# Patient Record
Sex: Female | Born: 1945 | Race: White | Hispanic: Yes | Marital: Married | Smoking: Never smoker
Health system: Southern US, Community
[De-identification: ages and names within clinical notes are randomized; demographics above are authoritative.]

## PROBLEM LIST (undated history)

## (undated) DIAGNOSIS — K219 Gastro-esophageal reflux disease without esophagitis: Secondary | ICD-10-CM

## (undated) DIAGNOSIS — I10 Essential (primary) hypertension: Secondary | ICD-10-CM

## (undated) DIAGNOSIS — F419 Anxiety disorder, unspecified: Secondary | ICD-10-CM

## (undated) DIAGNOSIS — R000 Tachycardia, unspecified: Secondary | ICD-10-CM

## (undated) DIAGNOSIS — F32A Depression, unspecified: Secondary | ICD-10-CM

## (undated) DIAGNOSIS — E119 Type 2 diabetes mellitus without complications: Secondary | ICD-10-CM

## (undated) DIAGNOSIS — R112 Nausea with vomiting, unspecified: Secondary | ICD-10-CM

## (undated) DIAGNOSIS — M1990 Unspecified osteoarthritis, unspecified site: Secondary | ICD-10-CM

## (undated) DIAGNOSIS — E7800 Pure hypercholesterolemia, unspecified: Secondary | ICD-10-CM

## (undated) DIAGNOSIS — F329 Major depressive disorder, single episode, unspecified: Secondary | ICD-10-CM

## (undated) DIAGNOSIS — K759 Inflammatory liver disease, unspecified: Secondary | ICD-10-CM

## (undated) DIAGNOSIS — E669 Obesity, unspecified: Secondary | ICD-10-CM

## (undated) DIAGNOSIS — Z9889 Other specified postprocedural states: Secondary | ICD-10-CM

## (undated) DIAGNOSIS — K259 Gastric ulcer, unspecified as acute or chronic, without hemorrhage or perforation: Secondary | ICD-10-CM

## (undated) DIAGNOSIS — F410 Panic disorder [episodic paroxysmal anxiety] without agoraphobia: Secondary | ICD-10-CM

## (undated) HISTORY — DX: Panic disorder (episodic paroxysmal anxiety): F41.0

## (undated) HISTORY — DX: Pure hypercholesterolemia, unspecified: E78.00

## (undated) HISTORY — DX: Tachycardia, unspecified: R00.0

## (undated) HISTORY — DX: Inflammatory liver disease, unspecified: K75.9

## (undated) HISTORY — DX: Anxiety disorder, unspecified: F41.9

## (undated) HISTORY — PX: BREAST BIOPSY: SHX20

## (undated) HISTORY — DX: Obesity, unspecified: E66.9

## (undated) HISTORY — PX: ABDOMINAL HYSTERECTOMY: SHX81

## (undated) HISTORY — DX: Major depressive disorder, single episode, unspecified: F32.9

## (undated) HISTORY — DX: Essential (primary) hypertension: I10

## (undated) HISTORY — PX: OTHER SURGICAL HISTORY: SHX169

## (undated) HISTORY — DX: Depression, unspecified: F32.A

## (undated) HISTORY — PX: CHOLECYSTECTOMY: SHX55

## (undated) HISTORY — PX: WRIST SURGERY: SHX841

## (undated) HISTORY — DX: Unspecified osteoarthritis, unspecified site: M19.90

## (undated) HISTORY — DX: Type 2 diabetes mellitus without complications: E11.9

---

## 1978-12-13 DIAGNOSIS — K759 Inflammatory liver disease, unspecified: Secondary | ICD-10-CM

## 1978-12-13 HISTORY — DX: Inflammatory liver disease, unspecified: K75.9

## 1995-08-17 ENCOUNTER — Encounter: Payer: Self-pay | Admitting: Gynecology

## 1996-01-10 ENCOUNTER — Encounter: Payer: Self-pay | Admitting: Gynecology

## 1996-02-01 ENCOUNTER — Encounter: Payer: Self-pay | Admitting: Gynecology

## 1996-02-23 ENCOUNTER — Encounter: Payer: Self-pay | Admitting: Gynecology

## 1997-06-29 ENCOUNTER — Encounter: Payer: Self-pay | Admitting: Gynecology

## 1998-08-16 ENCOUNTER — Other Ambulatory Visit: Admission: RE | Admit: 1998-08-16 | Discharge: 1998-08-16 | Payer: Self-pay | Admitting: Gynecology

## 1998-08-16 ENCOUNTER — Encounter: Payer: Self-pay | Admitting: Gynecology

## 1999-09-12 ENCOUNTER — Ambulatory Visit (HOSPITAL_COMMUNITY): Admission: RE | Admit: 1999-09-12 | Discharge: 1999-09-12 | Payer: Self-pay | Admitting: *Deleted

## 1999-09-12 ENCOUNTER — Encounter: Admission: RE | Admit: 1999-09-12 | Discharge: 1999-09-12 | Payer: Self-pay | Admitting: *Deleted

## 1999-09-12 ENCOUNTER — Encounter: Payer: Self-pay | Admitting: *Deleted

## 1999-12-04 ENCOUNTER — Encounter: Payer: Self-pay | Admitting: Gynecology

## 1999-12-04 ENCOUNTER — Other Ambulatory Visit: Admission: RE | Admit: 1999-12-04 | Discharge: 1999-12-04 | Payer: Self-pay | Admitting: Gynecology

## 2000-10-08 ENCOUNTER — Encounter: Admission: RE | Admit: 2000-10-08 | Discharge: 2000-10-08 | Payer: Self-pay | Admitting: *Deleted

## 2000-10-08 ENCOUNTER — Encounter: Payer: Self-pay | Admitting: *Deleted

## 2000-12-09 ENCOUNTER — Encounter: Admission: RE | Admit: 2000-12-09 | Discharge: 2001-01-05 | Payer: Self-pay | Admitting: Neurosurgery

## 2001-03-30 ENCOUNTER — Other Ambulatory Visit: Admission: RE | Admit: 2001-03-30 | Discharge: 2001-03-30 | Payer: Self-pay | Admitting: Gynecology

## 2001-03-30 ENCOUNTER — Encounter: Payer: Self-pay | Admitting: Gynecology

## 2001-05-11 ENCOUNTER — Emergency Department (HOSPITAL_COMMUNITY): Admission: EM | Admit: 2001-05-11 | Discharge: 2001-05-12 | Payer: Self-pay

## 2002-07-18 ENCOUNTER — Encounter: Payer: Self-pay | Admitting: Gynecology

## 2002-07-18 ENCOUNTER — Other Ambulatory Visit: Admission: RE | Admit: 2002-07-18 | Discharge: 2002-07-18 | Payer: Self-pay | Admitting: Gynecology

## 2002-08-18 ENCOUNTER — Encounter: Payer: Self-pay | Admitting: Gynecology

## 2003-06-25 ENCOUNTER — Encounter (INDEPENDENT_AMBULATORY_CARE_PROVIDER_SITE_OTHER): Payer: Self-pay | Admitting: Specialist

## 2003-06-25 ENCOUNTER — Ambulatory Visit (HOSPITAL_COMMUNITY): Admission: RE | Admit: 2003-06-25 | Discharge: 2003-06-25 | Payer: Self-pay | Admitting: Gastroenterology

## 2003-09-19 ENCOUNTER — Encounter: Payer: Self-pay | Admitting: Gynecology

## 2003-10-12 ENCOUNTER — Encounter: Payer: Self-pay | Admitting: Gynecology

## 2003-10-12 ENCOUNTER — Other Ambulatory Visit: Admission: RE | Admit: 2003-10-12 | Discharge: 2003-10-12 | Payer: Self-pay | Admitting: Gynecology

## 2004-07-07 ENCOUNTER — Encounter: Admission: RE | Admit: 2004-07-07 | Discharge: 2004-07-17 | Payer: Self-pay | Admitting: Neurosurgery

## 2005-01-20 ENCOUNTER — Encounter: Payer: Self-pay | Admitting: Gynecology

## 2005-01-20 ENCOUNTER — Other Ambulatory Visit: Admission: RE | Admit: 2005-01-20 | Discharge: 2005-01-20 | Payer: Self-pay | Admitting: Gynecology

## 2005-02-04 ENCOUNTER — Encounter: Payer: Self-pay | Admitting: Gynecology

## 2006-03-05 ENCOUNTER — Encounter: Payer: Self-pay | Admitting: Gynecology

## 2006-03-30 ENCOUNTER — Other Ambulatory Visit: Admission: RE | Admit: 2006-03-30 | Discharge: 2006-03-30 | Payer: Self-pay | Admitting: Gynecology

## 2006-03-30 ENCOUNTER — Encounter: Payer: Self-pay | Admitting: Gynecology

## 2007-01-21 ENCOUNTER — Ambulatory Visit: Payer: Self-pay | Admitting: Internal Medicine

## 2007-01-21 ENCOUNTER — Encounter: Payer: Self-pay | Admitting: Internal Medicine

## 2007-01-21 DIAGNOSIS — I10 Essential (primary) hypertension: Secondary | ICD-10-CM | POA: Insufficient documentation

## 2007-01-21 DIAGNOSIS — F3289 Other specified depressive episodes: Secondary | ICD-10-CM | POA: Insufficient documentation

## 2007-01-21 DIAGNOSIS — M810 Age-related osteoporosis without current pathological fracture: Secondary | ICD-10-CM | POA: Insufficient documentation

## 2007-01-21 DIAGNOSIS — R0609 Other forms of dyspnea: Secondary | ICD-10-CM

## 2007-01-21 DIAGNOSIS — K219 Gastro-esophageal reflux disease without esophagitis: Secondary | ICD-10-CM

## 2007-01-21 DIAGNOSIS — R42 Dizziness and giddiness: Secondary | ICD-10-CM

## 2007-01-21 DIAGNOSIS — E785 Hyperlipidemia, unspecified: Secondary | ICD-10-CM | POA: Insufficient documentation

## 2007-01-21 DIAGNOSIS — F329 Major depressive disorder, single episode, unspecified: Secondary | ICD-10-CM

## 2007-01-21 DIAGNOSIS — R0989 Other specified symptoms and signs involving the circulatory and respiratory systems: Secondary | ICD-10-CM | POA: Insufficient documentation

## 2007-01-24 ENCOUNTER — Encounter: Payer: Self-pay | Admitting: Internal Medicine

## 2007-01-25 DIAGNOSIS — R945 Abnormal results of liver function studies: Secondary | ICD-10-CM

## 2007-01-25 LAB — CONVERTED CEMR LAB
ALT: 48 units/L — ABNORMAL HIGH (ref 0–35)
AST: 45 units/L — ABNORMAL HIGH (ref 0–37)
Basophils Absolute: 0 10*3/uL (ref 0.0–0.1)
Calcium: 9.4 mg/dL (ref 8.4–10.5)
Eosinophils Absolute: 0.2 10*3/uL (ref 0.0–0.7)
Eosinophils Relative: 3 % (ref 0–5)
HCT: 44.4 % (ref 36.0–46.0)
Lymphs Abs: 2.6 10*3/uL (ref 0.7–3.3)
MCV: 84.1 fL (ref 78.0–100.0)
Neutrophils Relative %: 51 % (ref 43–77)
Platelets: 225 10*3/uL (ref 150–400)
Potassium: 4.8 meq/L (ref 3.5–5.3)
RDW: 13.2 % (ref 11.5–14.0)
Sodium: 140 meq/L (ref 135–145)
TSH: 1.798 microintl units/mL (ref 0.350–5.50)
WBC: 6.5 10*3/uL (ref 4.0–10.5)

## 2007-02-11 ENCOUNTER — Ambulatory Visit: Payer: Self-pay | Admitting: Internal Medicine

## 2007-02-11 DIAGNOSIS — R7309 Other abnormal glucose: Secondary | ICD-10-CM

## 2007-02-25 ENCOUNTER — Ambulatory Visit: Payer: Self-pay | Admitting: Internal Medicine

## 2007-02-28 DIAGNOSIS — E119 Type 2 diabetes mellitus without complications: Secondary | ICD-10-CM | POA: Insufficient documentation

## 2007-02-28 LAB — CONVERTED CEMR LAB
AST: 38 units/L — ABNORMAL HIGH (ref 0–37)
Albumin: 4.3 g/dL (ref 3.5–5.2)
Bilirubin, Direct: 0.1 mg/dL (ref 0.0–0.3)
CO2: 29 meq/L (ref 19–32)
Chloride: 99 meq/L (ref 96–112)
Cholesterol: 158 mg/dL (ref 0–200)
Creatinine, Ser: 0.6 mg/dL (ref 0.4–1.2)
Glucose, Bld: 189 mg/dL — ABNORMAL HIGH (ref 70–99)
HDL: 46 mg/dL (ref >39.0)
Hgb A1c MFr Bld: 8.5 % — ABNORMAL HIGH (ref 4.6–6.0)
Potassium: 3.8 meq/L (ref 3.5–5.1)
Sodium: 137 meq/L (ref 135–145)
Total Bilirubin: 0.6 mg/dL (ref 0.3–1.2)
Total Protein: 8.1 g/dL (ref 6.0–8.3)

## 2007-03-03 LAB — CONVERTED CEMR LAB: Hep A IgM: NEGATIVE

## 2007-03-04 ENCOUNTER — Encounter: Admission: RE | Admit: 2007-03-04 | Discharge: 2007-03-04 | Payer: Self-pay | Admitting: Internal Medicine

## 2007-03-14 ENCOUNTER — Encounter: Payer: Self-pay | Admitting: Internal Medicine

## 2007-03-14 ENCOUNTER — Encounter: Admission: RE | Admit: 2007-03-14 | Discharge: 2007-03-14 | Payer: Self-pay | Admitting: Internal Medicine

## 2007-04-15 ENCOUNTER — Encounter: Payer: Self-pay | Admitting: Internal Medicine

## 2007-04-15 ENCOUNTER — Encounter: Payer: Self-pay | Admitting: Gynecology

## 2007-04-15 ENCOUNTER — Other Ambulatory Visit: Admission: RE | Admit: 2007-04-15 | Discharge: 2007-04-15 | Payer: Self-pay | Admitting: Gynecology

## 2007-05-30 ENCOUNTER — Emergency Department: Payer: Self-pay | Admitting: Emergency Medicine

## 2007-06-06 ENCOUNTER — Encounter (INDEPENDENT_AMBULATORY_CARE_PROVIDER_SITE_OTHER): Payer: Self-pay | Admitting: *Deleted

## 2008-02-09 ENCOUNTER — Encounter: Payer: Self-pay | Admitting: Gynecology

## 2008-04-30 ENCOUNTER — Other Ambulatory Visit: Admission: RE | Admit: 2008-04-30 | Discharge: 2008-04-30 | Payer: Self-pay | Admitting: Gynecology

## 2008-04-30 ENCOUNTER — Encounter: Payer: Self-pay | Admitting: Gynecology

## 2008-04-30 ENCOUNTER — Ambulatory Visit: Payer: Self-pay | Admitting: Gynecology

## 2008-10-13 ENCOUNTER — Emergency Department: Payer: Self-pay | Admitting: Emergency Medicine

## 2009-06-05 ENCOUNTER — Other Ambulatory Visit: Admission: RE | Admit: 2009-06-05 | Discharge: 2009-06-05 | Payer: Self-pay | Admitting: Gynecology

## 2009-06-05 ENCOUNTER — Ambulatory Visit: Payer: Self-pay | Admitting: Gynecology

## 2009-06-11 ENCOUNTER — Encounter: Payer: Self-pay | Admitting: Gynecology

## 2009-07-31 ENCOUNTER — Ambulatory Visit: Payer: Self-pay | Admitting: Gynecology

## 2010-05-11 LAB — CONVERTED CEMR LAB: Pap Smear: NORMAL

## 2010-08-29 NOTE — Op Note (Signed)
Amber Patton, Amber Patton                          ACCOUNT NO.:  [id number]   MEDICAL RECORD NO.:  [id number]                   PATIENT TYPE:  AMB   LOCATION:  ENDO                                 FACILITY:  [facility]   PHYSICIAN:  Anselmo Rod, M.D.               DATE OF BIRTH:  11/30/1945   DATE OF PROCEDURE:  06/25/2003  DATE OF DISCHARGE:                                 OPERATIVE REPORT   PROCEDURE PERFORMED:  Colonoscopy with cold biopsies times two.   ENDOSCOPIST:  Charna Elizabeth, M.D.   INSTRUMENT USED:  Olympus video colonoscope.   INDICATIONS FOR PROCEDURE:  The patient is a 65 year old [place] undergoing  screening colonoscopy to rule out colonic polyps, masses, etc.   PREPROCEDURE PREPARATION:  Informed consent was procured from the patient.  The patient was fasted for eight hours prior to the procedure and prepped  with a bottle of magnesium citrate and a gallon of GoLYTELY the night prior  to the procedure.   PREPROCEDURE PHYSICAL:  The patient had stable vital signs.  Neck supple.  Chest clear to auscultation.  S1 and S2 regular.  Abdomen soft with normal  bowel sounds.   DESCRIPTION OF PROCEDURE:  The patient was placed in left lateral decubitus  position and sedated with 90 mg of Demerol and 9 mg of Versed intravenously  in slow incremental doses.  Once the patient was adequately sedated and  maintained on low flow oxygen and continuous cardiac monitoring, the Olympus  video colonoscope was advanced from the rectum to the cecum.  A small  sessile polyp was biopsied at 15 cm.  Small internal hemorrhoids were noted  on retroflexion.  There was no evidence of diverticulosis.  The procedure  was completed up to the cecum.  The appendicular orifice and ileocecal valve  were clearly visualized and photographed.   IMPRESSION:  1. Small nonbleeding internal hemorrhoids.  2. Small polyp biopsied at 15 cm.  3. Normal-appearing proximal left colon, transverse colon, right colon  and     cecum.   RECOMMENDATIONS:  1. Await pathology results.  2. Repeat colorectal cancer screening depending on pathology results.  3. Outpatient followup as need arises in the future.                                               Anselmo Rod, M.D.    JNM/MEDQ  D:  06/25/2003  T:  06/26/2003  Job:  [id number]   cc:   Gaetano Hawthorne. Lily Peer, M.D.  [address], [address]  [place]  [place] [address]  Fax: [phone]

## 2010-10-23 ENCOUNTER — Encounter: Payer: Self-pay | Admitting: Gynecology

## 2010-12-12 ENCOUNTER — Emergency Department (HOSPITAL_COMMUNITY)
Admission: EM | Admit: 2010-12-12 | Discharge: 2010-12-12 | Disposition: A | Discharge status: Home / Self Care | Payer: Medicare Other | Attending: Emergency Medicine | Admitting: Emergency Medicine

## 2010-12-12 ENCOUNTER — Emergency Department (HOSPITAL_COMMUNITY): Payer: Medicare Other

## 2010-12-12 DIAGNOSIS — I10 Essential (primary) hypertension: Secondary | ICD-10-CM | POA: Insufficient documentation

## 2010-12-12 DIAGNOSIS — M719 Bursopathy, unspecified: Secondary | ICD-10-CM | POA: Insufficient documentation

## 2010-12-12 DIAGNOSIS — E119 Type 2 diabetes mellitus without complications: Secondary | ICD-10-CM | POA: Insufficient documentation

## 2010-12-12 DIAGNOSIS — Z79899 Other long term (current) drug therapy: Secondary | ICD-10-CM | POA: Insufficient documentation

## 2010-12-12 DIAGNOSIS — M25519 Pain in unspecified shoulder: Secondary | ICD-10-CM | POA: Insufficient documentation

## 2010-12-12 DIAGNOSIS — M79609 Pain in unspecified limb: Secondary | ICD-10-CM | POA: Insufficient documentation

## 2010-12-12 DIAGNOSIS — E7800 Pure hypercholesterolemia, unspecified: Secondary | ICD-10-CM | POA: Insufficient documentation

## 2010-12-12 DIAGNOSIS — M67919 Unspecified disorder of synovium and tendon, unspecified shoulder: Secondary | ICD-10-CM | POA: Insufficient documentation

## 2010-12-12 DIAGNOSIS — M542 Cervicalgia: Secondary | ICD-10-CM | POA: Insufficient documentation

## 2011-01-16 ENCOUNTER — Encounter: Payer: Self-pay | Admitting: Gynecology

## 2011-08-25 ENCOUNTER — Encounter: Payer: Self-pay | Admitting: *Deleted

## 2011-12-25 ENCOUNTER — Encounter: Payer: Self-pay | Admitting: Cardiology

## 2012-07-31 ENCOUNTER — Encounter (HOSPITAL_COMMUNITY): Payer: Self-pay | Admitting: Emergency Medicine

## 2012-07-31 ENCOUNTER — Emergency Department (HOSPITAL_COMMUNITY)
Admission: EM | Admit: 2012-07-31 | Discharge: 2012-08-01 | Disposition: A | Discharge status: Home / Self Care | Payer: Medicare Other | Attending: Emergency Medicine | Admitting: Emergency Medicine

## 2012-07-31 DIAGNOSIS — R0602 Shortness of breath: Secondary | ICD-10-CM | POA: Insufficient documentation

## 2012-07-31 DIAGNOSIS — Z8719 Personal history of other diseases of the digestive system: Secondary | ICD-10-CM | POA: Insufficient documentation

## 2012-07-31 DIAGNOSIS — Z9089 Acquired absence of other organs: Secondary | ICD-10-CM | POA: Insufficient documentation

## 2012-07-31 DIAGNOSIS — E1169 Type 2 diabetes mellitus with other specified complication: Secondary | ICD-10-CM | POA: Insufficient documentation

## 2012-07-31 DIAGNOSIS — E119 Type 2 diabetes mellitus without complications: Secondary | ICD-10-CM

## 2012-07-31 DIAGNOSIS — R739 Hyperglycemia, unspecified: Secondary | ICD-10-CM

## 2012-07-31 DIAGNOSIS — E7800 Pure hypercholesterolemia, unspecified: Secondary | ICD-10-CM | POA: Insufficient documentation

## 2012-07-31 DIAGNOSIS — K219 Gastro-esophageal reflux disease without esophagitis: Secondary | ICD-10-CM | POA: Insufficient documentation

## 2012-07-31 DIAGNOSIS — Z90710 Acquired absence of both cervix and uterus: Secondary | ICD-10-CM | POA: Insufficient documentation

## 2012-07-31 DIAGNOSIS — Z79899 Other long term (current) drug therapy: Secondary | ICD-10-CM | POA: Insufficient documentation

## 2012-07-31 DIAGNOSIS — R112 Nausea with vomiting, unspecified: Secondary | ICD-10-CM | POA: Insufficient documentation

## 2012-07-31 DIAGNOSIS — R1013 Epigastric pain: Secondary | ICD-10-CM

## 2012-07-31 DIAGNOSIS — I10 Essential (primary) hypertension: Secondary | ICD-10-CM | POA: Insufficient documentation

## 2012-07-31 HISTORY — DX: Gastro-esophageal reflux disease without esophagitis: K21.9

## 2012-07-31 LAB — CBC WITH DIFFERENTIAL/PLATELET
Basophils Absolute: 0 10*3/uL (ref 0.0–0.1)
Basophils Relative: 0 % (ref 0–1)
Eosinophils Absolute: 0.1 10*3/uL (ref 0.0–0.7)
Hemoglobin: 14.4 g/dL (ref 12.0–15.0)
MCH: 27.4 pg (ref 26.0–34.0)
MCHC: 34.4 g/dL (ref 30.0–36.0)
Monocytes Relative: 3 % (ref 3–12)
Neutro Abs: 7.6 10*3/uL (ref 1.7–7.7)
Neutrophils Relative %: 83 % — ABNORMAL HIGH (ref 43–77)
Platelets: 202 10*3/uL (ref 150–400)
RDW: 13.6 % (ref 11.5–15.5)

## 2012-07-31 LAB — URINALYSIS, ROUTINE W REFLEX MICROSCOPIC
Glucose, UA: NEGATIVE mg/dL
Nitrite: NEGATIVE
Protein, ur: NEGATIVE mg/dL
Urobilinogen, UA: 0.2 mg/dL (ref 0.0–1.0)

## 2012-07-31 LAB — COMPREHENSIVE METABOLIC PANEL
AST: 14 U/L (ref 0–37)
Albumin: 4.1 g/dL (ref 3.5–5.2)
Alkaline Phosphatase: 129 U/L — ABNORMAL HIGH (ref 39–117)
Chloride: 94 mEq/L — ABNORMAL LOW (ref 96–112)
Potassium: 3.6 mEq/L (ref 3.5–5.1)
Sodium: 132 mEq/L — ABNORMAL LOW (ref 135–145)
Total Bilirubin: 0.4 mg/dL (ref 0.3–1.2)
Total Protein: 7.6 g/dL (ref 6.0–8.3)

## 2012-07-31 LAB — URINE MICROSCOPIC-ADD ON

## 2012-07-31 NOTE — ED Notes (Signed)
PT. REPORTS PERSISTENT NAUSEA WITH VOMITTING AFTER EATING CHICKEN WINGS LAST NIGHT WITH MID/UPPER ABDOMINAL CRAMPING.

## 2012-08-01 LAB — POCT I-STAT TROPONIN I

## 2012-08-01 MED ORDER — ONDANSETRON HCL 4 MG/2ML IJ SOLN
4.0000 mg | Freq: Once | INTRAMUSCULAR | Status: AC
  Administered 2012-08-01: 4 mg via INTRAVENOUS
  Filled 2012-08-01: qty 2

## 2012-08-01 MED ORDER — FAMOTIDINE IN NACL 20-0.9 MG/50ML-% IV SOLN
20.0000 mg | Freq: Once | INTRAVENOUS | Status: AC
  Administered 2012-08-01: 20 mg via INTRAVENOUS
  Filled 2012-08-01: qty 50

## 2012-08-01 MED ORDER — SODIUM CHLORIDE 0.9 % IV BOLUS (SEPSIS)
1000.0000 mL | Freq: Once | INTRAVENOUS | Status: AC
  Administered 2012-08-01: 1000 mL via INTRAVENOUS

## 2012-08-01 MED ORDER — ONDANSETRON HCL 4 MG PO TABS: 4.0000 mg | ORAL_TABLET | Freq: Four times a day (QID) | ORAL | Status: DC

## 2012-08-01 NOTE — ED Notes (Signed)
New EKG given to Dr Arnoldo Morale. No old EKG.

## 2012-08-01 NOTE — ED Notes (Signed)
Pt alert, NAD, calm, interactive, "feels better", reports mild pain & no nausea, d/c'd from monitor, up to b/r, changing back in to clothes in b/r, husband at [facility], VSS.

## 2012-08-01 NOTE — ED Provider Notes (Signed)
History     CSN: [id number]  Arrival date & time 07/31/12  2219   First MD Initiated Contact with Patient 07/31/12 2359      Chief Complaint  Patient presents with  . Emesis    (Consider location/radiation/quality/duration/timing/severity/associated sxs/prior treatment) HPI Patient is a pleasant middle aged type 2 diabetic with HTN and HL along with a remote h/o cholecystectomy and hysterectomy. She presents with complaints of nausea, vomiting and abdominal  Pain which began this morning. The patient has had 3 episodes of NBNB emesis. No diarrhea. No fever.  Patient is concerned that she may have eated some spoiled chicken. She says her sx began 28m after she finished some chicken that she had brought home from a restaurant a couple of days before.   Pain localizes to the midline epigastric and is nonradiating. It is constant, cramping, moderately severe. No excacerbating or relieving factors. No chest pain. No sob.    Past Medical History  Diagnosis Date  . DM2 (diabetes mellitus, type 2)   . HTN (hypertension)   . Hypercholesteremia   . Hepatitis 1980's  . GERD (gastroesophageal reflux disease)   . High cholesterol     Past Surgical History  Procedure Laterality Date  . Hysterectomy - unknown type    . Breast biopsy      Family History  Problem Relation Age of Onset  . Cancer    . Stroke    . Cirrhosis    . Diabetes    . Hyperlipidemia      History  Substance Use Topics  . Smoking status: Never Smoker   . Smokeless tobacco: Not on file  . Alcohol Use: Yes     Comment: rarely    OB History   Grav Para Term Preterm Abortions TAB SAB Ect Mult Living                  Review of Systems Gen: no weight loss, fevers, chills, night sweats Eyes: no discharge or drainage, no occular pain or visual changes Nose: no epistaxis or rhinorrhea Mouth: no dental pain, no sore throat Neck: no neck pain Lungs: no SOB, cough, wheezing CV: no chest pain, palpitations,  dependent edema or orthopnea Abd: As per history of present illness, otherwise negative GU: no dysuria or gross hematuria MSK: no myalgias or arthralgias Neuro: no headache, no focal neurologic deficits Skin: no rash Psyche: negative.  Allergies  Aspirin and Vicodin  Home Medications   Current Outpatient Rx  Name  Route  Sig  Dispense  Refill  . amLODipine (NORVASC) 10 MG tablet   Oral   Take 10 mg by mouth daily.         Marland Kitchen atorvastatin (LIPITOR) 10 MG tablet   Oral   Take 10 mg by mouth daily.         . butalbital-acetaminophen-caffeine (FIORICET, ESGIC) 50-325-40 MG per tablet   Oral   Take 1 tablet by mouth 2 (two) times daily as needed for headache.         . glimepiride (AMARYL) 4 MG tablet   Oral   Take 4 mg by mouth daily before breakfast.         . lisinopril-hydrochlorothiazide (PRINZIDE,ZESTORETIC) 20-25 MG per tablet   Oral   Take 1 tablet by mouth daily.         . metFORMIN (GLUCOPHAGE-XR) 500 MG 24 hr tablet   Oral   Take 1,500 mg by mouth daily with breakfast.          .  Multiple Vitamin (MULTIVITAMIN WITH MINERALS) TABS   Oral   Take 1 tablet by mouth daily.         Marland Kitchen omeprazole (PRILOSEC) 20 MG capsule   Oral   Take 20 mg by mouth daily.         . sertraline (ZOLOFT) 100 MG tablet   Oral   Take 100 mg by mouth daily.           BP 141/74  Pulse 82  Temp(Src) 98.9 F (37.2 C) (Oral)  Resp 14  SpO2 97%  Physical Exam Gen: well developed and well nourished appearing Head: NCAT Eyes: PERL, EOMI Nose: no epistaixis or rhinorrhea Mouth/throat: mucosa is moist and pink Neck: supple, no stridor Lungs: CTA B, no wheezing, rhonchi or rales Heart: Regular rate and rhythm, no murmur, extremities well perfused Abd: soft, mild midline epigastric tenderness nondistended Back: no ttp, no cva ttp Skin: no rashese, wnl Neuro: CN ii-xii grossly intact, no focal deficits Psyche; normal affect,  calm and cooperative.   ED Course   Procedures (including critical care time)   Results for orders placed during the hospital encounter of 07/31/12 (from the past 24 hour(s))  URINALYSIS, ROUTINE W REFLEX MICROSCOPIC     Status: Abnormal   Collection Time    07/31/12 10:30 PM      Result Value Range   Color, Urine YELLOW  YELLOW   APPearance CLEAR  CLEAR   Specific Gravity, Urine 1.012  1.005 - 1.030   pH 6.0  5.0 - 8.0   Glucose, UA NEGATIVE  NEGATIVE mg/dL   Hgb urine dipstick NEGATIVE  NEGATIVE   Bilirubin Urine NEGATIVE  NEGATIVE   Ketones, ur NEGATIVE  NEGATIVE mg/dL   Protein, ur NEGATIVE  NEGATIVE mg/dL   Urobilinogen, UA 0.2  0.0 - 1.0 mg/dL   Nitrite NEGATIVE  NEGATIVE   Leukocytes, UA MODERATE (*) NEGATIVE  CBC WITH DIFFERENTIAL     Status: Abnormal   Collection Time    07/31/12 10:30 PM      Result Value Range   WBC 9.2  4.0 - 10.5 K/uL   RBC 5.25 (*) 3.87 - 5.11 MIL/uL   Hemoglobin 14.4  12.0 - 15.0 g/dL   HCT 16.1  09.6 - 04.5 %   MCV 79.6  78.0 - 100.0 fL   MCH 27.4  26.0 - 34.0 pg   MCHC 34.4  30.0 - 36.0 g/dL   RDW 40.9  81.1 - 91.4 %   Platelets 202  150 - 400 K/uL   Neutrophils Relative 83 (*) 43 - 77 %   Neutro Abs 7.6  1.7 - 7.7 K/uL   Lymphocytes Relative 12  12 - 46 %   Lymphs Abs 1.1  0.7 - 4.0 K/uL   Monocytes Relative 3  3 - 12 %   Monocytes Absolute 0.3  0.1 - 1.0 K/uL   Eosinophils Relative 1  0 - 5 %   Eosinophils Absolute 0.1  0.0 - 0.7 K/uL   Basophils Relative 0  0 - 1 %   Basophils Absolute 0.0  0.0 - 0.1 K/uL  COMPREHENSIVE METABOLIC PANEL     Status: Abnormal   Collection Time    07/31/12 10:30 PM      Result Value Range   Sodium 132 (*) 135 - 145 mEq/L   Potassium 3.6  3.5 - 5.1 mEq/L   Chloride 94 (*) 96 - 112 mEq/L   CO2 28  19 - 32 mEq/L  Glucose, Bld 132 (*) 70 - 99 mg/dL   BUN 12  6 - 23 mg/dL   Creatinine, Ser 2.95  0.50 - 1.10 mg/dL   Calcium 9.4  8.4 - 62.1 mg/dL   Total Protein 7.6  6.0 - 8.3 g/dL   Albumin 4.1  3.5 - 5.2 g/dL   AST 14  0 - 37 U/L    ALT 13  0 - 35 U/L   Alkaline Phosphatase 129 (*) 39 - 117 U/L   Total Bilirubin 0.4  0.3 - 1.2 mg/dL   GFR calc non Af Amer >90  >90 mL/min   GFR calc Af Amer >90  >90 mL/min  URINE MICROSCOPIC-ADD ON     Status: Abnormal   Collection Time    07/31/12 10:30 PM      Result Value Range   Squamous Epithelial / LPF FEW (*) RARE   WBC, UA 7-10  <3 WBC/hpf   Bacteria, UA RARE  RARE  POCT I-STAT TROPONIN I     Status: None   Collection Time    08/01/12 12:38 AM      Result Value Range   Troponin i, poc 0.01  0.00 - 0.08 ng/mL   Comment 3            EKG: nsr, no acute ischemic changes, normal intervals, normal axis, normal qrs complex    MDM  Differential diagnosis: Acute pancreatitis, ACS, gastritis, enteritis, peptic ulcer disease.  The patient has normal vital signs and reassuring abdominal exam. Her CBC is normal and CMP is notable only for very mild hyponatremia and very mild hyperglycemia. We are awaiting results of lipase and troponin. The patient's EKG is normal. We're managing symptomatically with IV fluids, Pepcid and Zofran. Plan to by mouth challenge and, barring any contraindications, anticipate discharge home with close outpatient followup.        Brandt Loosen, MD 08/01/12 [phone]

## 2012-08-03 LAB — URINE CULTURE: Colony Count: 50000

## 2012-08-04 ENCOUNTER — Telehealth (HOSPITAL_COMMUNITY): Payer: Self-pay | Admitting: Emergency Medicine

## 2012-08-07 ENCOUNTER — Telehealth (HOSPITAL_COMMUNITY): Payer: Self-pay | Admitting: Emergency Medicine

## 2012-08-07 NOTE — ED Notes (Signed)
Chart returned from EDP office. Per [facility] PA-C, likely contaminant. No further abx treatment needed at this time.

## 2012-08-16 ENCOUNTER — Other Ambulatory Visit: Payer: Self-pay | Admitting: Gastroenterology

## 2012-08-16 DIAGNOSIS — R110 Nausea: Secondary | ICD-10-CM

## 2012-08-16 DIAGNOSIS — R1013 Epigastric pain: Secondary | ICD-10-CM

## 2012-08-29 ENCOUNTER — Encounter (HOSPITAL_COMMUNITY)
Admission: RE | Admit: 2012-08-29 | Discharge: 2012-08-29 | Disposition: A | Discharge status: Home / Self Care | Payer: Medicare Other | Source: Ambulatory Visit | Attending: Gastroenterology | Admitting: Gastroenterology

## 2012-08-29 DIAGNOSIS — R141 Gas pain: Secondary | ICD-10-CM | POA: Insufficient documentation

## 2012-08-29 DIAGNOSIS — R142 Eructation: Secondary | ICD-10-CM | POA: Insufficient documentation

## 2012-08-29 DIAGNOSIS — K3189 Other diseases of stomach and duodenum: Secondary | ICD-10-CM | POA: Insufficient documentation

## 2012-08-29 DIAGNOSIS — R110 Nausea: Secondary | ICD-10-CM | POA: Insufficient documentation

## 2012-08-29 DIAGNOSIS — R1013 Epigastric pain: Secondary | ICD-10-CM | POA: Insufficient documentation

## 2012-08-29 MED ORDER — TECHNETIUM TC 99M SULFUR COLLOID: 2.0000 | Freq: Once | INTRAVENOUS | Status: AC | PRN

## 2015-11-27 ENCOUNTER — Encounter: Payer: Self-pay | Admitting: Gynecology

## 2015-11-27 ENCOUNTER — Ambulatory Visit (INDEPENDENT_AMBULATORY_CARE_PROVIDER_SITE_OTHER): Payer: Medicare Other | Admitting: Gynecology

## 2015-11-27 VITALS — BP 130/90 | Ht <= 58 in | Wt 156.0 lb

## 2015-11-27 DIAGNOSIS — Z01419 Encounter for gynecological examination (general) (routine) without abnormal findings: Secondary | ICD-10-CM | POA: Diagnosis not present

## 2015-11-27 DIAGNOSIS — Z780 Asymptomatic menopausal state: Secondary | ICD-10-CM

## 2015-11-27 DIAGNOSIS — F329 Major depressive disorder, single episode, unspecified: Secondary | ICD-10-CM

## 2015-11-27 DIAGNOSIS — M8580 Other specified disorders of bone density and structure, unspecified site: Secondary | ICD-10-CM | POA: Diagnosis not present

## 2015-11-27 DIAGNOSIS — F419 Anxiety disorder, unspecified: Secondary | ICD-10-CM

## 2015-11-27 DIAGNOSIS — F32A Depression, unspecified: Secondary | ICD-10-CM

## 2015-11-27 NOTE — Progress Notes (Signed)
Amber GaskinsGladys P Patton [birth year] [id number]   History:    70 y.o.  for annual gyn exam who has not been seen in the office in 6 years. We reviewed her entire old medical record. She is currently being followed by Dr. Gabriel Earingavis Cloward which is her primary physician who is been monitoring treating her for hypertension, hypercholesterolemia and type 2 diabetes. [facility] basis as a result of her depression. Patient still feels somewhat depressed and lack of energy tired and wondered if she needed to be put back on hormone replacement therapy. She has been off of it now for over 6 years. Patient states she had a colonoscopy in 2005 benign colon polyps were removed. She has a past history of total abdominal hysterectomy with bilateral salpingo-oophorectomy. She stated her PCP ordered a bone density study last year for which we have no records. She does state that she had been diagnosed with osteopenia. Patient prior to her hysterectomy never had any history of any abnormal Pap smear.  Past medical history,surgical history, family history and social history were all reviewed and documented in the EPIC chart.  Gynecologic History No LMP recorded. Patient has had a hysterectomy. Contraception: status post hysterectomy Last Pap: 2008. Results were: normal Last mammogram: 2016. Results were: normal  Obstetric History OB History  Gravida Para Term Preterm AB Living  4 4       4   SAB TAB Ectopic Multiple Live Births               # Outcome Date GA Lbr Len/2nd Weight Sex Delivery Anes PTL Lv  4 Para           3 Para           2 Para           1 Para                ROS: A ROS was performed and pertinent positives and negatives are included in the history.  GENERAL: No fevers or chills. HEENT: No change in vision, no earache, sore throat or sinus congestion. NECK: No pain or stiffness. CARDIOVASCULAR: No chest pain or  pressure. No palpitations. PULMONARY: No shortness of breath, cough or wheeze. GASTROINTESTINAL: No abdominal pain, nausea, vomiting or diarrhea, melena or bright red blood per rectum. GENITOURINARY: No urinary frequency, urgency, hesitancy or dysuria. MUSCULOSKELETAL: No joint or muscle pain, no back pain, no recent trauma. DERMATOLOGIC: No rash, no itching, no lesions. ENDOCRINE: No polyuria, polydipsia, no heat or cold intolerance. No recent change in weight. HEMATOLOGICAL: No anemia or easy bruising or bleeding. NEUROLOGIC: No headache, seizures, numbness, tingling or weakness. PSYCHIATRIC: No depression, no loss of interest in normal activity or change in sleep pattern.     Exam: chaperone present  BP 130/90   Ht 4\' 9"  (1.448 m)   Wt 156 lb (70.8 kg)   BMI 33.76 kg/m   Body mass index is 33.76 kg/m.  General appearance : Well developed well nourished female. No acute distress HEENT: Eyes: no retinal hemorrhage or exudates,  Neck supple, trachea midline, no carotid bruits, no thyroidmegaly Lungs: Clear to auscultation, no rhonchi or wheezes, or rib retractions  Heart: Regular rate and rhythm, no murmurs or gallops Breast:Examined in sitting and supine position were symmetrical in appearance, no palpable masses or tenderness,  no skin retraction, no nipple inversion, no nipple discharge, no skin discoloration, no  axillary or supraclavicular lymphadenopathy Abdomen: no palpable masses or tenderness, no rebound or guarding Extremities: no edema or skin discoloration or tenderness  Pelvic:  Bartholin, Urethra, Skene Glands: Within normal limits             Vagina: No gross lesions or discharge, vaginal atrophy  Cervix: Absent  Uterus  absent Adnexa  Without masses or tenderness  Anus and perineum  normal   Rectovaginal  normal sphincter tone without palpated masses or tenderness             Hemoccult PCP provides     Assessment/Plan:  70 y.o. female for annual exam who is  postmenopausal has been off of hormone replacement therapy over 6 years. I have explained to her that she would be a high risk for DVT and pulmonary embolism decide breast cancer reinitiating estrogen replacement therapy at this age. She is not suffering from any vasomotor symptoms. She needs to consult her psychiatrist which she sees on a monthly basis to adjust her antidepressant medication which may be affecting her energy level as well as her PCP with the medication that she is on for hypertension. Pap smear no longer indicated. Patient decided release of the week and get a copy of her bone density study from her PCP to update her record. We discussed importance of calcium and vitamin D and weightbearing exercises for osteoporosis prevention. She is overdue for her colonoscopy and mammogram. I've given her instructions on vitamin D that she needs to be taking vitamin D3 cholecalciferol 2000 units daily and 600 mg of calcium daily.   Ok EdwardsFERNANDEZ,JUAN H MD, 3:10 PM 11/27/2015

## 2015-11-27 NOTE — Patient Instructions (Signed)
Trastorno de ansiedad generalizada (Generalized Anxiety Disorder) El trastorno de ansiedad generalizada es un trastorno mental. Interfiere en las funciones vitales, incluyendo las relaciones, el trabajo y la escuela.  Es diferente de la ansiedad normal que todas las personas experimentan en algn momento de su vida en respuesta a sucesos y actividades especficas. En verdad, la ansiedad normal nos ayuda a prepararnos y atravesar estos acontecimientos y actividades de la vida. La ansiedad normal desaparece despus de que el evento o la actividad ha finalizado.  El trastorno de ansiedad generalizada no est necesariamente relacionada con eventos o actividades especficas. Tambin causa un exceso de ansiedad en proporcin a sucesos o actividades especficas. En este trastorno la ansiedad es difcil de controlar. Los sntomas pueden variar de leves a muy graves. Las personas que sufren de trastorno de ansiedad generalizada pueden tener intensas olas de ansiedad con sntomas fsicos (ataques de pnico).  SNTOMAS  La ansiedad y la preocupacin asociada a este trastorno son difciles de controlar. Esta ansiedad y la preocupacin estn relacionados con muchos eventos de la vida y sus actividades y tambin ocurre durante ms das de los que no ocurre, durante 6 meses o ms. Las personas que la sufren pueden tener tres o ms de los siguientes sntomas (uno o ms en los nios):   Agitacin   Fatiga.  Dificultades de concentracin.   Irritabilidad.  Tensin muscular  Dificultad para dormirse o sueo poco satisfactorio. DIAGNSTICO  Se diagnostica a travs de una evaluacin realizada por el mdico. El mdico le har preguntas acerca de su estado de nimo, sntomas fsicos y sucesos de su vida. Le har preguntas sobre su historia clnica, el consumo de alcohol o drogas, incluyendo los medicamentos recetados. Tambin le har un examen fsico e indicar anlisis de sangre. Ciertas enfermedades y el uso de  determinadas sustancias pueden causar sntomas similares a este trastorno. Su mdico lo puede derivar a un especialista en salud mental para una evaluacin ms profunda..  TRATAMIENTO  Las terapias siguientes se utilizan en el tratamiento de este trastorno:   Medicamentos - Se recetan antidepresivos para el control diario a largo plazo. Pueden indicarse tambin medicamentos para combatir la ansiedad en los casos graves, especialmente cuando ocurren ataques de pnico.   Terapia conversada (psicoterapia) Ciertos tipos de psicoterapia pueden ser tiles en el tratamiento del trastorno de ansiedad generalizada, proporcionando apoyo, educacin y orientacin. Una forma de psicoterapia llamada terapia cognitivo-conductual puede ensearle formas saludables de pensar y reaccionar a los eventos y actividades de la vida diaria.  Tcnicasde manejo del estrs- Estas tcnicas incluyen el yoga, la meditacin y el ejercicio y pueden ser muy tiles cuando se practican con regularidad. Un especialista en salud mental puede ayudar a determinar qu tratamiento es mejor para usted. Algunas personas obtienen mejora con una terapia. Sin embargo, otras personas requieren una combinacin de terapias.    Esta informacin no tiene como fin reemplazar el consejo del mdico. Asegrese de hacerle al mdico cualquier pregunta que tenga.   Document Released: 07/25/2012 Document Revised: 04/20/2014 Elsevier Interactive Patient Education 2016 Else[employer] Inc[employer]  Trastorno depresivo mayor (Major Depressive Disorder) El trastorno depresivo mayor es una enfermedad mental. Tambin se llamado depresin clnica o depresin unipolar. Produce sentimientos de tristeza, desesperanza o desamparo. Algunas personas con trastorno depresivo mayor no se sienten particularmente tristes, pero pierden el inters en hacer las cosas que solan disfrutar (anhedonia). Tambin puede causar sntomas fsicos. Interfiere en el trabajo, la escuela, las  relaciones y otras actividades diarias normales. Puede variar en   gravedad, pero es ms duradera y ms grave que la tristeza que todos sentimos de vez en cuando en nuestras vidas.  Muchas veces es desencadenada por sucesos estresantes o cambios importantes en la vida. Algunos ejemplos de estos factores desencadenantes son el divorcio, la prdida del trabajo o el hogar, una mudanza, y la muerte de un familiar o amigo cercano. A veces aparece sin ninguna razn evidente. Las personas que tienen familiares con depresin mayor o con trastorno bipolar tienen ms riesgo de desarrollar depresin mayor con o sin factores de estrs. Puede ocurrir a cualquier edad. Puede ocurrir slo una vez en su vida(episodio nico de trastorno depresivo mayor). Puede ocurrir varias veces (trastorno depresivo mayor recurrente).  SNTOMAS  Las personas con trastorno depresivo mayor presentan anhedonia o estado de nimo deprimido casi todos los das durante al menos 2 semanas o ms. Los sntomas son:   Sensacin de tristeza (melancola) o vaco.  Sentimientos de desesperanza o desamparo.  Lagrimeo o episodios de llanto ( es observado por los dems).  Irritabilidad (en nios y adolescentes). Adems del estado de nimo deprimido o la anhedonia o ambos, estos enfermos tienen al menos cuatro de los siguientes sntomas :   Dificultad para dormir o dormir demasiado.   Cambio significativo (aumento o disminucin) en el apetito o el peso.   Falta de energa o motivacin.  Sentimientos de culpa o desvalorizacin.   Dificultad para concentrarse, recordar o tomar decisiones.  Movimientos inusualmente lentos (retardo psicomotor retardation) o inquietud (segn lo observado por los dems).   Deseos recurrentes de muerte, pensamientos recurrentes de autoagresin (suicidio) o intento de suicidio. Las personas con trastorno depresivo mayor suelen tener pensamientos persistentes negativos acerca de s mismos, de otras personas y  del mundo. Las personas con trastorno depresivo mayor grave pueden experimentar creencias o percepciones distorsionadas sobre el mundo (delirios psicticos). Tambin pueden ver u or cosas que no son reales(alucinaciones psicticas).  DIAGNSTICO  El diagnstico se realiza mediante una evaluacin hecha por el mdico. El mdico le preguntar acerca de los aspectos de su vida cotidiana, como el estado de nimo, el sueo y el apetito, para ver si usted tiene los sntomas de depresin mayor. Le har preguntas sobre su historial mdico y el consumo de alcohol o drogas, incluyendo medicamentos recetados. Tambin le har un examen fsico y le indicar anlisis de sangre. Esto se debe a que ciertas enfermedades y el uso de determinadas sustancias pueden causar sntomas similares a la depresin (depresin secundaria). Su mdico tambin podra derivarlo a un especialista en salud mental para una evaluacin y tratamiento.  TRATAMIENTO  Es importante reconocer los sntomas y buscar tratamiento. Los siguientes tratamientos pueden indicarse:    Medicamentos - Generalmente se recetan antidepresivos. Los antidepresivos se piensa que corrigen los desequilibrios qumicos en el cerebro que se asocian comnmente a la depresin mayor. Se pueden agregar otros tipos de medicamentos si los sntomas no responden a los antidepresivos solos o si hay ideas delirantes o alucinaciones psicticas.  Psicoterapia - ciertos tipos de psicoterapia pueden ser tiles en el tratamiento del trastorno de la depresin mayor, proporcionando apoyo, educacin y orientacin. Ciertos tipos de psicoterapia tambin pueden ayudar a superar los pensamientos negativos (terapia cognitivo conductual) y a problemas de relacin que desencadena la depresin mayor (terapia interpersonal). Un especialista en salud mental puede ayudarlo a determinar qu tratamiento es el mejor para usted. La mayora de los pacientes mejoran con una combinacin de medicacin y  psicoterapia. Los tratamientos que implican la estimulacin elctrica del cerebro   pueden ser utilizados en situaciones con sntomas muy graves o cuando los medicamentos y la psicoterapia no funcionan despus de un tiempo. Estos tratamientos incluyen terapia electroconvulsiva, estimulacin magntica transcraneal y estimulacin del nervio vago.    Esta informacin no tiene como fin reemplazar el consejo del mdico. Asegrese de hacerle al mdico cualquier pregunta que tenga.   Document Released: 07/25/2012 Document Revised: 04/20/2014 Elsevier Interactive Patient Education 2016 Elsevier Inc.  

## 2015-12-09 ENCOUNTER — Inpatient Hospital Stay (HOSPITAL_COMMUNITY)
Admission: EM | Admit: 2015-12-09 | Discharge: 2015-12-10 | DRG: 309 | Disposition: A | Discharge status: Home / Self Care | Payer: Medicare Other | Attending: Internal Medicine | Admitting: Internal Medicine

## 2015-12-09 ENCOUNTER — Emergency Department (HOSPITAL_COMMUNITY): Payer: Medicare Other

## 2015-12-09 ENCOUNTER — Inpatient Hospital Stay (HOSPITAL_COMMUNITY): Payer: Medicare Other

## 2015-12-09 ENCOUNTER — Encounter (HOSPITAL_COMMUNITY): Payer: Self-pay | Admitting: Emergency Medicine

## 2015-12-09 DIAGNOSIS — E7800 Pure hypercholesterolemia, unspecified: Secondary | ICD-10-CM | POA: Diagnosis present

## 2015-12-09 DIAGNOSIS — E118 Type 2 diabetes mellitus with unspecified complications: Secondary | ICD-10-CM | POA: Diagnosis not present

## 2015-12-09 DIAGNOSIS — G4700 Insomnia, unspecified: Secondary | ICD-10-CM | POA: Diagnosis present

## 2015-12-09 DIAGNOSIS — R251 Tremor, unspecified: Secondary | ICD-10-CM | POA: Diagnosis present

## 2015-12-09 DIAGNOSIS — Z79899 Other long term (current) drug therapy: Secondary | ICD-10-CM | POA: Diagnosis not present

## 2015-12-09 DIAGNOSIS — H538 Other visual disturbances: Secondary | ICD-10-CM | POA: Diagnosis not present

## 2015-12-09 DIAGNOSIS — F05 Delirium due to known physiological condition: Secondary | ICD-10-CM | POA: Diagnosis not present

## 2015-12-09 DIAGNOSIS — K3184 Gastroparesis: Secondary | ICD-10-CM | POA: Diagnosis present

## 2015-12-09 DIAGNOSIS — R000 Tachycardia, unspecified: Secondary | ICD-10-CM | POA: Diagnosis present

## 2015-12-09 DIAGNOSIS — I10 Essential (primary) hypertension: Secondary | ICD-10-CM | POA: Diagnosis present

## 2015-12-09 DIAGNOSIS — R410 Disorientation, unspecified: Secondary | ICD-10-CM

## 2015-12-09 DIAGNOSIS — Z885 Allergy status to narcotic agent status: Secondary | ICD-10-CM

## 2015-12-09 DIAGNOSIS — E785 Hyperlipidemia, unspecified: Secondary | ICD-10-CM

## 2015-12-09 DIAGNOSIS — I4891 Unspecified atrial fibrillation: Principal | ICD-10-CM | POA: Diagnosis present

## 2015-12-09 DIAGNOSIS — Z7984 Long term (current) use of oral hypoglycemic drugs: Secondary | ICD-10-CM

## 2015-12-09 DIAGNOSIS — N390 Urinary tract infection, site not specified: Secondary | ICD-10-CM | POA: Diagnosis present

## 2015-12-09 DIAGNOSIS — F418 Other specified anxiety disorders: Secondary | ICD-10-CM | POA: Diagnosis present

## 2015-12-09 DIAGNOSIS — E1143 Type 2 diabetes mellitus with diabetic autonomic (poly)neuropathy: Secondary | ICD-10-CM | POA: Diagnosis present

## 2015-12-09 DIAGNOSIS — R531 Weakness: Secondary | ICD-10-CM

## 2015-12-09 DIAGNOSIS — Z886 Allergy status to analgesic agent status: Secondary | ICD-10-CM | POA: Diagnosis not present

## 2015-12-09 DIAGNOSIS — Z833 Family history of diabetes mellitus: Secondary | ICD-10-CM | POA: Diagnosis not present

## 2015-12-09 DIAGNOSIS — Z8249 Family history of ischemic heart disease and other diseases of the circulatory system: Secondary | ICD-10-CM

## 2015-12-09 DIAGNOSIS — K219 Gastro-esophageal reflux disease without esophagitis: Secondary | ICD-10-CM | POA: Diagnosis present

## 2015-12-09 DIAGNOSIS — F410 Panic disorder [episodic paroxysmal anxiety] without agoraphobia: Secondary | ICD-10-CM | POA: Diagnosis present

## 2015-12-09 DIAGNOSIS — I471 Supraventricular tachycardia: Secondary | ICD-10-CM | POA: Diagnosis not present

## 2015-12-09 HISTORY — DX: Nausea with vomiting, unspecified: R11.2

## 2015-12-09 HISTORY — DX: Nausea with vomiting, unspecified: Z98.890

## 2015-12-09 LAB — URINALYSIS, ROUTINE W REFLEX MICROSCOPIC
BILIRUBIN URINE: NEGATIVE
Glucose, UA: NEGATIVE mg/dL
KETONES UR: 15 mg/dL — AB
NITRITE: NEGATIVE
PROTEIN: NEGATIVE mg/dL
Specific Gravity, Urine: 1.015 (ref 1.005–1.030)
pH: 6.5 (ref 5.0–8.0)

## 2015-12-09 LAB — CBC WITH DIFFERENTIAL/PLATELET
BASOS ABS: 0 10*3/uL (ref 0.0–0.1)
BASOS PCT: 0 %
EOS ABS: 0.1 10*3/uL (ref 0.0–0.7)
Eosinophils Relative: 1 %
HEMATOCRIT: 43.8 % (ref 36.0–46.0)
HEMOGLOBIN: 14.8 g/dL (ref 12.0–15.0)
Lymphocytes Relative: 18 %
Lymphs Abs: 2.2 10*3/uL (ref 0.7–4.0)
MCH: 28.4 pg (ref 26.0–34.0)
MCHC: 33.8 g/dL (ref 30.0–36.0)
MCV: 83.9 fL (ref 78.0–100.0)
Monocytes Absolute: 0.8 10*3/uL (ref 0.1–1.0)
Monocytes Relative: 7 %
NEUTROS ABS: 8.7 10*3/uL — AB (ref 1.7–7.7)
NEUTROS PCT: 74 %
Platelets: 303 10*3/uL (ref 150–400)
RBC: 5.22 MIL/uL — AB (ref 3.87–5.11)
RDW: 12.8 % (ref 11.5–15.5)
WBC: 11.7 10*3/uL — AB (ref 4.0–10.5)

## 2015-12-09 LAB — MAGNESIUM: Magnesium: 1.4 mg/dL — ABNORMAL LOW (ref 1.7–2.4)

## 2015-12-09 LAB — COMPREHENSIVE METABOLIC PANEL
ALK PHOS: 107 U/L (ref 38–126)
ALT: 23 U/L (ref 14–54)
ANION GAP: 18 — AB (ref 5–15)
AST: 21 U/L (ref 15–41)
Albumin: 4.9 g/dL (ref 3.5–5.0)
BILIRUBIN TOTAL: 0.6 mg/dL (ref 0.3–1.2)
BUN: 10 mg/dL (ref 6–20)
CALCIUM: 10.4 mg/dL — AB (ref 8.9–10.3)
CO2: 19 mmol/L — AB (ref 22–32)
Chloride: 98 mmol/L — ABNORMAL LOW (ref 101–111)
Creatinine, Ser: 0.71 mg/dL (ref 0.44–1.00)
Glucose, Bld: 216 mg/dL — ABNORMAL HIGH (ref 65–99)
Potassium: 3.4 mmol/L — ABNORMAL LOW (ref 3.5–5.1)
SODIUM: 135 mmol/L (ref 135–145)
TOTAL PROTEIN: 8 g/dL (ref 6.5–8.1)

## 2015-12-09 LAB — URINE MICROSCOPIC-ADD ON

## 2015-12-09 LAB — CBG MONITORING, ED: GLUCOSE-CAPILLARY: 213 mg/dL — AB (ref 65–99)

## 2015-12-09 LAB — MRSA PCR SCREENING: MRSA by PCR: NEGATIVE

## 2015-12-09 LAB — PHOSPHORUS: PHOSPHORUS: 3.5 mg/dL (ref 2.5–4.6)

## 2015-12-09 LAB — ETHANOL

## 2015-12-09 LAB — TSH: TSH: 1.9 u[IU]/mL (ref 0.350–4.500)

## 2015-12-09 LAB — I-STAT TROPONIN, ED: TROPONIN I, POC: 0 ng/mL (ref 0.00–0.08)

## 2015-12-09 LAB — GLUCOSE, CAPILLARY
GLUCOSE-CAPILLARY: 98 mg/dL (ref 65–99)
GLUCOSE-CAPILLARY: 155 mg/dL — AB (ref 65–99)
Glucose-Capillary: 126 mg/dL — ABNORMAL HIGH (ref 65–99)

## 2015-12-09 LAB — HEPARIN LEVEL (UNFRACTIONATED): HEPARIN UNFRACTIONATED: 0.3 [IU]/mL (ref 0.30–0.70)

## 2015-12-09 LAB — TROPONIN I
Troponin I: <0.03 ng/mL (ref <0.03)
Troponin I: <0.03 ng/mL (ref <0.03)

## 2015-12-09 LAB — T4, FREE: Free T4: 0.76 ng/dL (ref 0.61–1.12)

## 2015-12-09 MED ORDER — ATORVASTATIN CALCIUM 10 MG PO TABS
10.0000 mg | ORAL_TABLET | Freq: Every day | ORAL | Status: DC
  Administered 2015-12-09 – 2015-12-10 (×2): 10 mg via ORAL
  Filled 2015-12-09 (×2): qty 1

## 2015-12-09 MED ORDER — STROKE: EARLY STAGES OF RECOVERY BOOK
Freq: Once | Status: AC
  Administered 2015-12-09: 12:00:00
  Filled 2015-12-09: qty 1

## 2015-12-09 MED ORDER — GLIMEPIRIDE 4 MG PO TABS
4.0000 mg | ORAL_TABLET | Freq: Every day | ORAL | Status: DC
  Administered 2015-12-10: 4 mg via ORAL
  Filled 2015-12-09: qty 1

## 2015-12-09 MED ORDER — DILTIAZEM HCL 100 MG IV SOLR
5.0000 mg/h | INTRAVENOUS | Status: DC
  Administered 2015-12-09: 10 mg/h via INTRAVENOUS
  Administered 2015-12-09: 5 mg/h via INTRAVENOUS
  Filled 2015-12-09 (×4): qty 100

## 2015-12-09 MED ORDER — OXYCODONE HCL 5 MG PO TABS: 5.0000 mg | ORAL_TABLET | ORAL | Status: DC | PRN

## 2015-12-09 MED ORDER — HEPARIN (PORCINE) IN NACL 100-0.45 UNIT/ML-% IJ SOLN
900.0000 [IU]/h | INTRAMUSCULAR | Status: DC
  Filled 2015-12-09: qty 250

## 2015-12-09 MED ORDER — INSULIN ASPART 100 UNIT/ML ~~LOC~~ SOLN: 0.0000 [IU] | Freq: Every day | SUBCUTANEOUS | Status: DC

## 2015-12-09 MED ORDER — LISINOPRIL-HYDROCHLOROTHIAZIDE 20-25 MG PO TABS: 1.0000 | ORAL_TABLET | Freq: Every day | ORAL | Status: DC

## 2015-12-09 MED ORDER — MAGNESIUM SULFATE 2 GM/50ML IV SOLN
2.0000 g | Freq: Once | INTRAVENOUS | Status: AC
  Administered 2015-12-09: 2 g via INTRAVENOUS
  Filled 2015-12-09 (×2): qty 50

## 2015-12-09 MED ORDER — LORAZEPAM 2 MG/ML IJ SOLN
1.0000 mg | Freq: Once | INTRAMUSCULAR | Status: AC
Start: 2015-12-09 — End: 2015-12-09
  Administered 2015-12-09: 1 mg via INTRAVENOUS
  Filled 2015-12-09: qty 1

## 2015-12-09 MED ORDER — MIRTAZAPINE 7.5 MG PO TABS
15.0000 mg | ORAL_TABLET | Freq: Every day | ORAL | Status: DC
  Administered 2015-12-09: 15 mg via ORAL
  Filled 2015-12-09: qty 2

## 2015-12-09 MED ORDER — ACETAMINOPHEN 325 MG PO TABS: 650.0000 mg | ORAL_TABLET | Freq: Four times a day (QID) | ORAL | Status: DC | PRN

## 2015-12-09 MED ORDER — LISINOPRIL 20 MG PO TABS
20.0000 mg | ORAL_TABLET | Freq: Every day | ORAL | Status: DC
  Administered 2015-12-09 – 2015-12-10 (×3): 20 mg via ORAL
  Filled 2015-12-09 (×2): qty 1

## 2015-12-09 MED ORDER — HEPARIN BOLUS VIA INFUSION
3000.0000 [IU] | Freq: Once | INTRAVENOUS | Status: AC
  Administered 2015-12-09: 3000 [IU] via INTRAVENOUS
  Filled 2015-12-09: qty 3000

## 2015-12-09 MED ORDER — ACETAMINOPHEN 650 MG RE SUPP: 650.0000 mg | Freq: Four times a day (QID) | RECTAL | Status: DC | PRN

## 2015-12-09 MED ORDER — SODIUM CHLORIDE 0.9 % IV SOLN
INTRAVENOUS | Status: DC
  Administered 2015-12-09 (×2): via INTRAVENOUS

## 2015-12-09 MED ORDER — GADOBENATE DIMEGLUMINE 529 MG/ML IV SOLN
15.0000 mL | Freq: Once | INTRAVENOUS | Status: AC | PRN
  Administered 2015-12-09: 15 mL via INTRAVENOUS

## 2015-12-09 MED ORDER — PROMETHAZINE HCL 25 MG PO TABS: 12.5000 mg | ORAL_TABLET | Freq: Four times a day (QID) | ORAL | Status: DC | PRN

## 2015-12-09 MED ORDER — HEPARIN (PORCINE) IN NACL 100-0.45 UNIT/ML-% IJ SOLN
800.0000 [IU]/h | INTRAMUSCULAR | Status: DC
  Administered 2015-12-09: 800 [IU]/h via INTRAVENOUS
  Filled 2015-12-09: qty 250

## 2015-12-09 MED ORDER — BUPROPION HCL ER (XL) 150 MG PO TB24
450.0000 mg | ORAL_TABLET | Freq: Every day | ORAL | Status: DC
  Administered 2015-12-09 – 2015-12-10 (×2): 450 mg via ORAL
  Filled 2015-12-09 (×2): qty 3

## 2015-12-09 MED ORDER — STROKE: EARLY STAGES OF RECOVERY BOOK
Freq: Once | Status: DC
  Administered 2015-12-09: 1
  Filled 2015-12-09: qty 1

## 2015-12-09 MED ORDER — SODIUM CHLORIDE 0.9% FLUSH: 3.0000 mL | Freq: Two times a day (BID) | INTRAVENOUS | Status: DC

## 2015-12-09 MED ORDER — INSULIN ASPART 100 UNIT/ML ~~LOC~~ SOLN
0.0000 [IU] | Freq: Three times a day (TID) | SUBCUTANEOUS | Status: DC
  Administered 2015-12-09: 1 [IU] via SUBCUTANEOUS
  Administered 2015-12-10: 3 [IU] via SUBCUTANEOUS

## 2015-12-09 MED ORDER — LORAZEPAM 2 MG/ML IJ SOLN: 1.0000 mg | Freq: Four times a day (QID) | INTRAMUSCULAR | Status: DC | PRN

## 2015-12-09 MED ORDER — HYDROCHLOROTHIAZIDE 25 MG PO TABS
25.0000 mg | ORAL_TABLET | Freq: Every day | ORAL | Status: DC
Start: 2015-12-09 — End: 2015-12-10
  Administered 2015-12-09 – 2015-12-10 (×2): 25 mg via ORAL
  Filled 2015-12-09 (×3): qty 1

## 2015-12-09 MED ORDER — LORAZEPAM 2 MG/ML IJ SOLN: 1.0000 mg | Freq: Once | INTRAMUSCULAR | Status: DC

## 2015-12-09 MED ORDER — DILTIAZEM HCL 100 MG IV SOLR: 5.0000 mg/h | Freq: Once | INTRAVENOUS | Status: DC

## 2015-12-09 MED ORDER — DEXTROSE 5 % IV SOLN
1.0000 g | INTRAVENOUS | Status: DC
  Administered 2015-12-09 – 2015-12-10 (×2): 1 g via INTRAVENOUS
  Filled 2015-12-09 (×2): qty 10

## 2015-12-09 MED ORDER — SODIUM CHLORIDE 0.9 % IV BOLUS (SEPSIS)
1000.0000 mL | Freq: Once | INTRAVENOUS | Status: AC
  Administered 2015-12-09: 1000 mL via INTRAVENOUS

## 2015-12-09 MED ORDER — METOCLOPRAMIDE HCL 5 MG PO TABS
5.0000 mg | ORAL_TABLET | Freq: Three times a day (TID) | ORAL | Status: DC
  Administered 2015-12-09 – 2015-12-10 (×4): 5 mg via ORAL
  Filled 2015-12-09 (×5): qty 1

## 2015-12-09 MED ORDER — DILTIAZEM LOAD VIA INFUSION
15.0000 mg | Freq: Once | INTRAVENOUS | Status: AC
  Administered 2015-12-09: 15 mg via INTRAVENOUS
  Filled 2015-12-09: qty 15

## 2015-12-09 MED ORDER — TRAZODONE HCL 50 MG PO TABS
50.0000 mg | ORAL_TABLET | Freq: Every day | ORAL | Status: DC
  Administered 2015-12-09: 50 mg via ORAL
  Filled 2015-12-09: qty 1

## 2015-12-09 MED ORDER — ONDANSETRON HCL 4 MG/2ML IJ SOLN
4.0000 mg | Freq: Once | INTRAMUSCULAR | Status: AC
  Administered 2015-12-09: 4 mg via INTRAVENOUS
  Filled 2015-12-09: qty 2

## 2015-12-09 MED ORDER — AMLODIPINE BESYLATE 10 MG PO TABS
10.0000 mg | ORAL_TABLET | Freq: Every day | ORAL | Status: DC
  Administered 2015-12-10: 10 mg via ORAL
  Filled 2015-12-09 (×2): qty 1

## 2015-12-09 NOTE — Consult Note (Signed)
CARDIOLOGY CONSULT NOTE   Patient ID: NENA HAMPE MRN: [id number] DOB/AGE: Feb 25, 1946 70 y.o.  Admit date: 12/09/2015  Primary Physician   Lavell Islam, MD Primary Cardiologist   New Reason for Consultation   ?atrial fibrillation Requesting Physician  Dr. Konrad Dolores  HPI: ALANEE TING is a 70 y.o. female with a history of panic attack, hypertension, hyperlipidemia, diabetes, otitis, GERD and anxiety who presented to [facility] last night for evaluation of tremors, blurry vision and speech difficulty.  Daughter at bedside intact as an interpreter. Patient frequently gets panic attack when she first woke up in the morning. Yesterday she woke up with a similar episode however later developed a tremors. Described as a shakiness of hand and upper extremities. Later in a day developed a blurry vision and speech difficulty leading to further evaluation. The patient denies chest pain, shortness of breath, palpitation, syncope, orthopnea, PND, lower extremity edema, melena, blood in her stool or urine. No illness or travel. One episode of nonbilious and nonbloody vomiting. Admits to having intermittent dizziness. No prior history of tobacco abuse. Mother had MI.  Urine analysis suspicious for UTI. Symptoms improved with Ativan. EKG shows sinus rhythm/junctional  at rate of 123 bpm. Review of telemetry shows sinus tachycardia at rate about 120. No arrhythmia noted. TSH and free T4 normal. Troponin negative. Magnesium 1.4. Alcohol level was within normal limits. pnding blood culture. CT of head negative for acute abnormality. Chest x-ray showed mild bronchitis changes.  Plans to start IV Cardizem and IV heparin.   Past Medical History:  Diagnosis Date  . Anxiety   . Arthritis   . Depression   . DM2 (diabetes mellitus, type 2) (HCC)   . GERD (gastroesophageal reflux disease)   . Hepatitis 1980's  . HTN (hypertension)   . Hypercholesteremia   . PONV (postoperative nausea and  vomiting)      Past Surgical History:  Procedure Laterality Date  . ABDOMINAL HYSTERECTOMY     TAH/BSO  . BREAST BIOPSY    . CHOLECYSTECTOMY    . hysterectomy - unknown type    . WRIST SURGERY      Allergies  Allergen Reactions  . Aspirin Other (See Comments)    Reaction unknown  . Vicodin [Hydrocodone-Acetaminophen] Other (See Comments)    Reaction unknown    I have reviewed the patient's current medications .  stroke: mapping our early stages of recovery book   Does not apply Once  . [START ON 12/10/2015] amLODipine  10 mg Oral Daily  . atorvastatin  10 mg Oral Daily  . buPROPion  450 mg Oral Daily  . cefTRIAXone (ROCEPHIN)  IV  1 g Intravenous Q24H  . diltiazem  15 mg Intravenous Once  . [START ON 12/10/2015] glimepiride  4 mg Oral QAC breakfast  . heparin  3,000 Units Intravenous Once  . lisinopril  20 mg Oral Daily   And  . hydrochlorothiazide  25 mg Oral Daily  . insulin aspart  0-5 Units Subcutaneous QHS  . insulin aspart  0-9 Units Subcutaneous TID WC  . magnesium sulfate 1 - 4 g bolus IVPB  2 g Intravenous Once  . mirtazapine  15 mg Oral QHS  . sodium chloride flush  3 mL Intravenous Q12H  . traZODone  50 mg Oral QHS   . sodium chloride    . diltiazem (CARDIZEM) infusion    . heparin     acetaminophen **OR** acetaminophen, LORazepam, oxyCODONE, promethazine  Prior to Admission medications  Medication Sig Start Date End Date Taking? Authorizing Provider  amLODipine (NORVASC) 10 MG tablet Take 10 mg by mouth daily.   Yes Historical Provider, MD  atorvastatin (LIPITOR) 10 MG tablet Take 10 mg by mouth daily.   Yes Historical Provider, MD  buPROPion (WELLBUTRIN XL) 150 MG 24 hr tablet Take 450 mg by mouth daily.    Yes Historical Provider, MD  butalbital-acetaminophen-caffeine (FIORICET, ESGIC) 50-325-40 MG per tablet Take 1 tablet by mouth 2 (two) times daily as needed for headache.   Yes Historical Provider, MD  glimepiride (AMARYL) 4 MG tablet Take 4 mg by  mouth daily before breakfast.   Yes Historical Provider, MD  lisinopril-hydrochlorothiazide (PRINZIDE,ZESTORETIC) 20-25 MG per tablet Take 1 tablet by mouth daily.   Yes Historical Provider, MD  metFORMIN (GLUCOPHAGE-XR) 500 MG 24 hr tablet Take 1,500 mg by mouth daily with breakfast.    Yes Historical Provider, MD  metoCLOPramide (REGLAN) 10 MG tablet Take 5 mg by mouth 4 (four) times daily -  before meals and at bedtime. 08/06/15  Yes Historical Provider, MD  mirtazapine (REMERON) 15 MG tablet Take 15 mg by mouth at bedtime.   Yes Historical Provider, MD  traZODone (DESYREL) 50 MG tablet Take 50 mg by mouth at bedtime.   Yes Historical Provider, MD     Social History   Social History  . Marital status: Married    Spouse name: N/A  . Number of children: 4  . Years of education: N/A   Occupational History  . home maker    Social History Main Topics  . Smoking status: Never Smoker  . Smokeless tobacco: Never Used  . Alcohol use No     Comment: rarely  . Drug use: No  . Sexual activity: No   Other Topics Concern  . Not on file   Social History Narrative  . No narrative on file    Family Status  Relation Status  .    .    .    .    .    . Father Deceased  . Paternal Grandmother Deceased  . Mother Deceased   Family History  Problem Relation Age of Onset  . Cancer    . Stroke    . Cirrhosis    . Diabetes    . Hyperlipidemia    . Stomach cancer Father   . Diabetes Paternal Grandmother   . Hypertension Paternal Grandmother   . Stroke Paternal Grandmother   . Heart attack Mother   . Aneurysm Mother      ROS:  Full 14 point review of systems complete and found to be negative unless listed above.  Physical Exam: Blood pressure 123/81, pulse (!) 130, temperature 98.1 F (36.7 C), temperature source Oral, resp. rate 18, height 4\' 9"  (1.448 m), weight 156 lb (70.8 kg), SpO2 93 %.  General: Well developed, well nourished, female in no acute distress Head: Eyes  PERRLA, No xanthomas. Normocephalic and atraumatic, oropharynx without edema or exudate.  Lungs: Resp regular and unlabored, CTA. Heart: Regular rhythm with tachycardia no s3, s4, or murmurs..   Neck: No carotid bruits. No lymphadenopathy. No JVD. Abdomen: Bowel sounds present, abdomen soft and non-tender without masses or hernias noted. Msk:  No spine or cva tenderness. No weakness, no joint deformities or effusions. Extremities: No clubbing, cyanosis or edema. DP/PT/Radials 2+ and equal bilaterally. Neuro: Alert and oriented X 3. No focal deficits noted. Psych:  Good affect, responds appropriately Skin: No rashes or  lesions noted.  Labs:   Lab Results  Component Value Date   WBC 11.7 (H) 12/09/2015   HGB 14.8 12/09/2015   HCT 43.8 12/09/2015   MCV 83.9 12/09/2015   PLT 303 12/09/2015   No results for input(s): INR in the last 72 hours.  Recent Labs Lab 12/09/15 0300  NA 135  K 3.4*  CL 98*  CO2 19*  BUN 10  CREATININE 0.71  CALCIUM 10.4*  PROT 8.0  BILITOT 0.6  ALKPHOS 107  ALT 23  AST 21  GLUCOSE 216*  ALBUMIN 4.9   Magnesium  Date Value Ref Range Status  12/09/2015 1.4 (L) 1.7 - 2.4 mg/dL Final    Recent Labs  16/01/9607/28/17 0926  TROPONINI <0.03    Recent Labs  12/09/15 0316  TROPIPOC 0.00   No results found for: PROBNP Lab Results  Component Value Date   CHOL 158 02/25/2007   HDL 46.0 02/25/2007   TRIG 201 (HH) 02/25/2007   No results found for: DDIMER Lipase  Date/Time Value Ref Range Status  08/01/2012 12:30 AM 22 11 - 59 U/L Final   TSH  Date/Time Value Ref Range Status  12/09/2015 09:26 AM 1.900 0.350 - 4.500 uIU/mL Final  01/21/2007 08:21 PM 1.798 0.350 - 5.50 microintl units/mL Final    Comment:    See lab report for associated comment(s)   No results found for: VITAMINB12, FOLATE, FERRITIN, TIBC, IRON, RETICCTPCT  Echo: Pending  ECG:  Sinus rhythm Vent. rate 126 BPM PR interval * ms QRS duration 88 ms QT/QTc 320/464 ms P-R-T  axes * 237 70  Radiology:  Dg Chest 2 View  Result Date: 12/09/2015 CLINICAL DATA:  Shortness of breath. History of hypertension, diabetes. EXAM: CHEST  2 VIEW COMPARISON:  Chest radiograph January 21, 2007 FINDINGS: Cardiomediastinal silhouette is normal. Mild bronchitic changes. No pleural effusions or focal consolidations. Trachea projects midline and there is no pneumothorax. Soft tissue planes and included osseous structures are non-suspicious. Surgical clips in the included right abdomen compatible with cholecystectomy. IMPRESSION: Mild bronchitic changes. Electronically Signed   By: Awilda Metroourtnay  Bloomer M.D.   On: 12/09/2015 04:27   Ct Head Wo Contrast  Result Date: 12/09/2015 CLINICAL DATA:  Altered mental status. History of hypertension, hyperlipidemia and diabetes EXAM: CT HEAD WITHOUT CONTRAST TECHNIQUE: Contiguous axial images were obtained from the base of the skull through the vertex without intravenous contrast. COMPARISON:  None. FINDINGS: BRAIN: The ventricles and sulci are normal for age. No intraparenchymal hemorrhage, mass effect nor midline shift. Patchy supratentorial white matter hypodensities less than expected for patient's age, though non-specific are most compatible with chronic small vessel ischemic disease. No acute large vascular territory infarcts. No abnormal extra-axial fluid collections. Basal cisterns are patent. VASCULAR: Mildly dense intracranial vessels suggests hemoconcentration. Mild calcific atherosclerosis of the carotid siphons appear SKULL: No skull fracture. No significant scalp soft tissue swelling. SINUSES/ORBITS: The included ocular globes and orbital contents are non-suspicious.The mastoid air-cells and included paranasal sinuses are well-aerated. OTHER: None. IMPRESSION: Negative CT HEAD for age. Electronically Signed   By: Awilda Metroourtnay  Bloomer M.D.   On: 12/09/2015 05:06    ASSESSMENT AND PLAN:     1. Sinus arrhythmia - Difficult to determine rhythm. Rhythm  regular with tachycardia. Sinus/juctinal tachycardia vs SVT vs afib.   TSH normal. Pending echocardiogram (better if done when rate stable). Continue IV cardizem and IV heparin for now. Can try BB if needed. Her tremor/blurred vision and speech difficult likely non cardiac. MD to  review.   2. Hypomagnesemia - Supplement given.   3. HTN - Stable. Continue current regimen.   4. HLD - On statin  Otherwise per primary:   Confusion   Tremor   Diabetes mellitus with complication (HCC)   HLD (hyperlipidemia)   UTI (urinary tract infection)   Blurry vision   Depression with anxiety   Insomnia   Signed: Bhagat,Bhavinkumar, PA 12/09/2015, 11:12 AM Pager [phone] Patient seen and examined. I agree with the assessment and plan as detailed above. See also my additional thoughts below.   I agree with the node is outlined above. The patient's rhythm is not the primary problem here. However it is difficult to know exactly what her rhythm is. Old EKG shows relatively low P wave amplitude. However there is a good P wave in lead 2. I have recommended that we use IV diltiazem to see what effect it has on the rhythm. A beta blocker may be chosen a later date also. Two-dimensional echo is to be done.  Willa Rough, MD, [facility] 12/09/2015 12:13 PM

## 2015-12-09 NOTE — Progress Notes (Signed)
ANTICOAGULATION CONSULT NOTE - Follow Up Consult  Pharmacy Consult for Heparin Indication: atrial fibrillation  Allergies  Allergen Reactions  . Aspirin Other (See Comments)    Reaction unknown  . Vicodin [Hydrocodone-Acetaminophen] Other (See Comments)    Reaction unknown    Patient Measurements: Height: 4\' 9"  (144.8 cm) Weight: 156 lb (70.8 kg) IBW/kg (Calculated) : 38.6 Heparin Dosing Weight: 55kg  Vital Signs: Temp: 97.9 F (36.6 C) (08/28 1959) Temp Source: Oral (08/28 1959) BP: 123/82 (08/28 1959) Pulse Rate: 121 (08/28 1959)  Labs:  Recent Labs  12/09/15 0300 12/09/15 0926 12/09/15 1833 12/09/15 2017  HGB 14.8  --   --   --   HCT 43.8  --   --   --   PLT 303  --   --   --   HEPARINUNFRC  --   --   --  0.30  CREATININE 0.71  --   --   --   TROPONINI  --  <0.03 <0.03  --     Estimated Creatinine Clearance: 53.2 mL/min (by C-G formula based on SCr of 0.8 mg/dL).   Medications:  Heparin @ 800 units/hr  Assessment: 70yof started on heparin earlier today for ? afib. Initial heparin level is therapeutic on the low end at 0.30. Both CT head and MRI/MRA negative for acute stroke.  Goal of Therapy:  Heparin level 0.3-0.7 Monitor platelets by anticoagulation protocol: Yes   Plan:  1) Increase heparin to 900 units/hr 2) Follow up daily heparin level and CBC  Fredrik RiggerMarkle, Dyan Labarbera Sue 12/09/2015,9:07 PM

## 2015-12-09 NOTE — ED Notes (Signed)
This Charge RN asked to attempt IV placement. 2 other RNs Brooke and Delorise Jacksonori attempted x3 without success.

## 2015-12-09 NOTE — Progress Notes (Signed)
ANTICOAGULATION CONSULT NOTE - Initial Consult  Pharmacy Consult for Heparin IV infusion Indication: atrial fibrillation  Allergies  Allergen Reactions  . Aspirin Other (See Comments)    Reaction unknown  . Vicodin [Hydrocodone-Acetaminophen] Other (See Comments)    Reaction unknown    Patient Measurements: Height: 4\' 9"  (144.8 cm) Weight: 156 lb (70.8 kg) IBW/kg (Calculated) : 38.6 Heparin Dosing Weight: 55 kg   Vital Signs: Temp: 98.1 F (36.7 C) (08/28 0900) Temp Source: Oral (08/28 0900) BP: 123/81 (08/28 0900) Pulse Rate: 130 (08/28 0900)  Labs:  Recent Labs  12/09/15 0300  HGB 14.8  HCT 43.8  PLT 303  CREATININE 0.71    Estimated Creatinine Clearance: 53.2 mL/min (by C-G formula based on SCr of 0.8 mg/dL).   Medical History: Past Medical History:  Diagnosis Date  . Anxiety   . Arthritis   . Depression   . DM2 (diabetes mellitus, type 2) (HCC)   . GERD (gastroesophageal reflux disease)   . Hepatitis 1980's  . HTN (hypertension)   . Hypercholesteremia     Medications:  Prescriptions Prior to Admission  Medication Sig Dispense Refill Last Dose  . amLODipine (NORVASC) 10 MG tablet Take 10 mg by mouth daily.   12/08/2015 at Unknown time  . atorvastatin (LIPITOR) 10 MG tablet Take 10 mg by mouth daily.   12/08/2015 at Unknown time  . buPROPion (WELLBUTRIN XL) 150 MG 24 hr tablet Take 450 mg by mouth daily.    12/08/2015 at Unknown time  . butalbital-acetaminophen-caffeine (FIORICET, ESGIC) 50-325-40 MG per tablet Take 1 tablet by mouth 2 (two) times daily as needed for headache.   Past Week at Unknown time  . glimepiride (AMARYL) 4 MG tablet Take 4 mg by mouth daily before breakfast.   12/08/2015 at Unknown time  . lisinopril-hydrochlorothiazide (PRINZIDE,ZESTORETIC) 20-25 MG per tablet Take 1 tablet by mouth daily.   12/08/2015 at Unknown time  . metFORMIN (GLUCOPHAGE-XR) 500 MG 24 hr tablet Take 1,500 mg by mouth daily with breakfast.    12/08/2015 at Unknown  time  . metoCLOPramide (REGLAN) 10 MG tablet Take 5 mg by mouth 4 (four) times daily -  before meals and at bedtime.   12/08/2015 at Unknown time  . mirtazapine (REMERON) 15 MG tablet Take 15 mg by mouth at bedtime.   12/08/2015 at Unknown time  . traZODone (DESYREL) 50 MG tablet Take 50 mg by mouth at bedtime.   Past Week at Unknown time   Scheduled:  .  stroke: mapping our early stages of recovery book   Does not apply Once  . [START ON 12/10/2015] amLODipine  10 mg Oral Daily  . atorvastatin  10 mg Oral Daily  . buPROPion  450 mg Oral Daily  . cefTRIAXone (ROCEPHIN)  IV  1 g Intravenous Q24H  . diltiazem  15 mg Intravenous Once  . [START ON 12/10/2015] glimepiride  4 mg Oral QAC breakfast  . lisinopril  20 mg Oral Daily   And  . hydrochlorothiazide  25 mg Oral Daily  . insulin aspart  0-5 Units Subcutaneous QHS  . insulin aspart  0-9 Units Subcutaneous TID WC  . mirtazapine  15 mg Oral QHS  . sodium chloride flush  3 mL Intravenous Q12H  . traZODone  50 mg Oral QHS    Assessment: 70 y.o female admitted 12/09/15 early AM with chief complaints of tremor and blurry vision.  History includes daily panic attacks.  Tachycardia noted in ED, suspected intermittent Afib vs sinus  tach. Intermittent episodes of HR in the 140 and irregular. No h/o cardiac disease. Not on anticoagulant prior to admission. Pharmacy consulted to dose IV heparin for Atial fibrillation. CBC within normal limits.  No bleeding reported.     Goal of Therapy:  Heparin level 0.3-0.7 units/ml Monitor platelets by anticoagulation protocol: Yes   Plan:  Heparin 3000 units IV x1  Heparin drip at rate of 800 units/hr Heparin level 8 hr after start of heparin Daily hepain level and CBC   Noah Delaine, RPh Clinical Pharmacist Pager: [phone] 12/09/2015,9:45 AM

## 2015-12-09 NOTE — Progress Notes (Signed)
Asked by PP to see patient regarding SDU need - patient alert -oriented - co-op - warm and dry denies CP or SOB - no palpitations - resting quietly in bed - BP 124/78 HR 125 RR 16 O2 sats 96% on RA - NAD - HR has been tachy since ED admission.  Patient stable enough for SDU status on Progressive Care 3W - Dr. Konrad DoloresMerrell updated by Synetta FailAnita RN - charge RN.  2nd IV inserted right hand times one attempt for Heparin and Cardizem as needed - cardiology on floor to see patient.  Staff to call as needed.

## 2015-12-09 NOTE — Progress Notes (Signed)
Patient off unit, down to MRI. Patient requested I take her glasses back to unit. Glasses placed on bedside table. Amber AnchorsKendell M Catriona Dillenbeck, RN 12/09/2015 4:11 PM

## 2015-12-09 NOTE — H&P (Signed)
History and Physical    Amber Patton ZOX:[id number] DOB: [birth year] DOA: 12/09/2015  PCP: Lavell Islam, MD Patient coming from: home Chief Complaint: tremor and blurry vision   HPI: Amber Patton is a 70 y.o. female with medical history significant of panick attacks, depression, DM, GERD, HTN, Hepatitis, HLD, presenting w/ blurry vision, and tremor and garbled speech. Of note patient's husband and daughter are acting as her interpreter as her English is limited. Patient reports waking up on 12/08/2015 with a sensation of tremor and shakiness in her hands along with intermittent episodes of blurry vision and vision loss. Her vision would last only a matter of seconds. Patient also had a couple of episodes over the course the day of garbled speech per her husband. Patient also endorses headache described as "shocking or pulling of the brain. " Over the course of the day symptoms slowly improved however just before bedtime symptoms significantly worse and she came to the emergency room. Symptoms improved with rest. No aggravating factors. When symptoms worsened overnight she endorse 1 episode of nonbilious non-bloody emesis. Of note patient does endorse having daily panic attacks when she first wakes up in the morning. Denies any dysuria, frequency, chest pain, palpitations, back pain, diarrhea, constipation, LOC.   ED Course: Objective findings below. Ativan given w/ only minimal relief of overal subjective symptoms.   Review of Systems: As per HPI otherwise 10 point review of systems negative.   Ambulatory Status: no restrictions  Past Medical History:  Diagnosis Date  . Anxiety   . Arthritis   . Depression   . DM2 (diabetes mellitus, type 2) (HCC)   . GERD (gastroesophageal reflux disease)   . Hepatitis 1980's  . HTN (hypertension)   . Hypercholesteremia     Past Surgical History:  Procedure Laterality Date  . ABDOMINAL HYSTERECTOMY     TAH/BSO  . BREAST BIOPSY    .  hysterectomy - unknown type      Social History   Social History  . Marital status: Married    Spouse name: N/A  . Number of children: 4  . Years of education: N/A   Occupational History  . home maker    Social History Main Topics  . Smoking status: Never Smoker  . Smokeless tobacco: Never Used  . Alcohol use No     Comment: rarely  . Drug use: Unknown  . Sexual activity: No   Other Topics Concern  . Not on file   Social History Narrative  . No narrative on file    Allergies  Allergen Reactions  . Aspirin Other (See Comments)    Reaction unknown  . Vicodin [Hydrocodone-Acetaminophen] Other (See Comments)    Reaction unknown    Family History  Problem Relation Age of Onset  . Cancer    . Stroke    . Cirrhosis    . Diabetes    . Hyperlipidemia    . Cancer Father     LUNG?  . Diabetes Paternal Grandmother   . Hypertension Paternal Grandmother   . Stroke Paternal Grandmother     Prior to Admission medications   Medication Sig Start Date End Date Taking? Authorizing Provider  amLODipine (NORVASC) 10 MG tablet Take 10 mg by mouth daily.   Yes Historical Provider, MD  atorvastatin (LIPITOR) 10 MG tablet Take 10 mg by mouth daily.   Yes Historical Provider, MD  buPROPion (WELLBUTRIN XL) 150 MG 24 hr tablet Take 450 mg by mouth daily.  Yes Historical Provider, MD  butalbital-acetaminophen-caffeine (FIORICET, ESGIC) 50-325-40 MG per tablet Take 1 tablet by mouth 2 (two) times daily as needed for headache.   Yes Historical Provider, MD  glimepiride (AMARYL) 4 MG tablet Take 4 mg by mouth daily before breakfast.   Yes Historical Provider, MD  lisinopril-hydrochlorothiazide (PRINZIDE,ZESTORETIC) 20-25 MG per tablet Take 1 tablet by mouth daily.   Yes Historical Provider, MD  metFORMIN (GLUCOPHAGE-XR) 500 MG 24 hr tablet Take 1,500 mg by mouth daily with breakfast.    Yes Historical Provider, MD  metoCLOPramide (REGLAN) 10 MG tablet Take 5 mg by mouth 4 (four) times  daily -  before meals and at bedtime. 08/06/15  Yes Historical Provider, MD  mirtazapine (REMERON) 15 MG tablet Take 15 mg by mouth at bedtime.   Yes Historical Provider, MD  traZODone (DESYREL) 50 MG tablet Take 50 mg by mouth at bedtime.   Yes Historical Provider, MD    Physical Exam: Vitals:   12/09/15 0600 12/09/15 0630 12/09/15 0730 12/09/15 0745  BP: 111/96 115/84 119/74   Pulse: (!) 129     Resp: 18 22  18   Temp:      TempSrc:      SpO2: 92% 95% 95%   Weight:      Height:         General:  Appears anxious and resting in bed.  Eyes:  PERRL, EOMI, normal lids, iris ENT:  grossly normal hearing, lips & tongue, mmm Neck:  no LAD, masses or thyromegaly Cardiovascular: Tachycardia, intermittently irregular rate and rhythm, no m/r/g. No LE edema.  Respiratory:  CTA bilaterally, no w/r/r. Normal respiratory effort. Abdomen:  soft, ntnd, NABS Skin:  no rash or induration seen on limited exam Musculoskeletal:  grossly normal tone BUE/BLE, good ROM, no bony abnormality Psychiatric:  grossly normal mood and affect, speech fluent and appropriate, AOx3 Neurologic:  CN 2-12 grossly intact, moves all extremities in coordinated fashion, sensation intact  Labs on Admission: I have personally reviewed following labs and imaging studies  CBC:  Recent Labs Lab 12/09/15 0300  WBC 11.7*  NEUTROABS 8.7*  HGB 14.8  HCT 43.8  MCV 83.9  PLT 303   Basic Metabolic Panel:  Recent Labs Lab 12/09/15 0300  NA 135  K 3.4*  CL 98*  CO2 19*  GLUCOSE 216*  BUN 10  CREATININE 0.71  CALCIUM 10.4*   GFR: Estimated Creatinine Clearance: 53.2 mL/min (by C-G formula based on SCr of 0.8 mg/dL). Liver Function Tests:  Recent Labs Lab 12/09/15 0300  AST 21  ALT 23  ALKPHOS 107  BILITOT 0.6  PROT 8.0  ALBUMIN 4.9   No results for input(s): LIPASE, AMYLASE in the last 168 hours. No results for input(s): AMMONIA in the last 168 hours. Coagulation Profile: No results for input(s):  INR, PROTIME in the last 168 hours. Cardiac Enzymes: No results for input(s): CKTOTAL, CKMB, CKMBINDEX, TROPONINI in the last 168 hours. BNP (last 3 results) No results for input(s): PROBNP in the last 8760 hours. HbA1C: No results for input(s): HGBA1C in the last 72 hours. CBG:  Recent Labs Lab 12/09/15 0241  GLUCAP 213*   Lipid Profile: No results for input(s): CHOL, HDL, LDLCALC, TRIG, CHOLHDL, LDLDIRECT in the last 72 hours. Thyroid Function Tests: No results for input(s): TSH, T4TOTAL, FREET4, T3FREE, THYROIDAB in the last 72 hours. Anemia Panel: No results for input(s): VITAMINB12, FOLATE, FERRITIN, TIBC, IRON, RETICCTPCT in the last 72 hours. Urine analysis:    Component Value  Date/Time   COLORURINE YELLOW 12/09/2015 0529   APPEARANCEUR CLEAR 12/09/2015 0529   LABSPEC 1.015 12/09/2015 0529   PHURINE 6.5 12/09/2015 0529   GLUCOSEU NEGATIVE 12/09/2015 0529   HGBUR TRACE (A) 12/09/2015 0529   BILIRUBINUR NEGATIVE 12/09/2015 0529   KETONESUR 15 (A) 12/09/2015 0529   PROTEINUR NEGATIVE 12/09/2015 0529   UROBILINOGEN 0.2 07/31/2012 2230   NITRITE NEGATIVE 12/09/2015 0529   LEUKOCYTESUR MODERATE (A) 12/09/2015 0529    Creatinine Clearance: Estimated Creatinine Clearance: 53.2 mL/min (by C-G formula based on SCr of 0.8 mg/dL).  Sepsis Labs: @LABRCNTIP (procalcitonin:4,lacticidven:4) )No results found for this or any previous visit (from the past 240 hour(s)).   Radiological Exams on Admission: Dg Chest 2 View  Result Date: 12/09/2015 CLINICAL DATA:  Shortness of breath. History of hypertension, diabetes. EXAM: CHEST  2 VIEW COMPARISON:  Chest radiograph January 21, 2007 FINDINGS: Cardiomediastinal silhouette is normal. Mild bronchitic changes. No pleural effusions or focal consolidations. Trachea projects midline and there is no pneumothorax. Soft tissue planes and included osseous structures are non-suspicious. Surgical clips in the included right abdomen compatible with  cholecystectomy. IMPRESSION: Mild bronchitic changes. Electronically Signed   By: Awilda Metroourtnay  Bloomer M.D.   On: 12/09/2015 04:27   Ct Head Wo Contrast  Result Date: 12/09/2015 CLINICAL DATA:  Altered mental status. History of hypertension, hyperlipidemia and diabetes EXAM: CT HEAD WITHOUT CONTRAST TECHNIQUE: Contiguous axial images were obtained from the base of the skull through the vertex without intravenous contrast. COMPARISON:  None. FINDINGS: BRAIN: The ventricles and sulci are normal for age. No intraparenchymal hemorrhage, mass effect nor midline shift. Patchy supratentorial white matter hypodensities less than expected for patient's age, though non-specific are most compatible with chronic small vessel ischemic disease. No acute large vascular territory infarcts. No abnormal extra-axial fluid collections. Basal cisterns are patent. VASCULAR: Mildly dense intracranial vessels suggests hemoconcentration. Mild calcific atherosclerosis of the carotid siphons appear SKULL: No skull fracture. No significant scalp soft tissue swelling. SINUSES/ORBITS: The included ocular globes and orbital contents are non-suspicious.The mastoid air-cells and included paranasal sinuses are well-aerated. OTHER: None. IMPRESSION: Negative CT HEAD for age. Electronically Signed   By: Awilda Metroourtnay  Bloomer M.D.   On: 12/09/2015 05:06    EKG: Independently reviewed.  Assessment/Plan Principal Problem:   Atrial fibrillation with rapid ventricular response (HCC) Active Problems:   Confusion   Tremor   Diabetes mellitus with complication (HCC)   HLD (hyperlipidemia)   UTI (urinary tract infection)   Blurry vision   Depression with anxiety   Insomnia   Tachycardia: Etiology not immediately clear. Suspect intermittent Afib vs sinus tach. Possible dehydration and infectious cause given possible UTI but less likely . No evidence of medication or metabolic etiologies. EKG not definitive but during my exam pt w/ intermittent  episodes of HR in the 140 and irregular. No h/o cardiac disease.  - Stepdown - Dilt drip - Heparin for anticoagulation until determine true Afib status and need for long term anticoagulation. - Trop - EKG in am - Echo - THyroid studies - IVF - allergy to ASA??? - Cards consult - Discussed case w/ Dr. Myrtis SerKatz and greatly appreciate the help of the cardiology team  Abnormal urinalysis: UA worriesome for infection but pt w/o classic symptoms of dysuria, frequency, flank pain, fever. Currently w/ tremor and leukocytosis and Tachycardia. Pt states she's never had a UTI - f/u UCX, and BCX - Ceftriaxone. DC if UCX negative  Tremor: suspect from #1 - Treatment as above  Weakness, HA, Blurry  vision, garbled speech: likely related to #1 but cannot r/o intracranial process. CT head negative. No evidence of metabolic or medication induced etiology. Cannot r/o hormonal or Stroke/TIA or infectious w/ systemic symptoms vs psychosomatic. Currently no appreciable tremor and no blurry vision or difficulty w/ speech.  - Studies as above - MRI head/neck - Echo as above - PT - consider neuro consult vs psych depending on results - resume home fioricet for HA after MRI results  DM: on orals only.  - SSI - continue reglan for gastroparesis  HTN: low normal - starting on dilt drip for #1 - Resume Norvasc, lisiniporil, HCTZ  HLD: - continue lipitor  Insomnia: - contineu trazodone  Depression/Anxiety: - continue remeron, Wellbutrin - Ativan PRN  DVT prophylaxis: Heparin  Code Status: FULL  Family Communication: husband and daughter  Disposition Plan: pending improvement and workup  Consults called: Cards  Admission status: Inpt - SDU    Yilin Weedon J MD [facility]  If 7PM-7AM, please contact night-coverage [email or web address] Password [facility]  12/09/2015, 9:24 AM

## 2015-12-09 NOTE — ED Notes (Signed)
Pt on bedpan at the time. 

## 2015-12-09 NOTE — ED Triage Notes (Signed)
Patient family states patient has been hallucinating, and shaking all over. Patient anxious and shaking. Denies substance ingestion. Daughter states patient has panic attack every day when she wakes up. Patient alert. States she is intermittently losing vision.

## 2015-12-09 NOTE — ED Provider Notes (Signed)
[facility]-EMERGENCY DEPT Provider Note   CSN: [id number] Arrival date & time: 12/09/15  4098  By signing my name below, I, Alyssa Grove, attest that this documentation has been prepared under the direction and in the presence of Tilden Fossa, MD. Electronically Signed: Alyssa Grove, ED Scribe. 12/09/15. 3:19 AM.   History   Chief Complaint No chief complaint on file.  The history is provided by the patient, a relative and the spouse. No language interpreter was used.   HPI Comments: Amber Patton is a 70 y.o. female with PMHx of Depression, DM, Anxiety, GERD, and HTN who presents to the Emergency Department complaining of gradual onset, intermittent tremors. Pt states she woke up this morning with full body tremors and decreased control over her body, both of which were worse in her extremities. Husband states that the pt received slight relief to her symptoms, but that tonight the symptoms began to return and gradually worsen. Pt reports associated episodic loss of vision for about 30 secs. Pt denies hx of psychosis, hospitilization due to mental illness, dementia. Family states pt has a panic attakc every morning when she wakes up. She denies seizures, fever, headache, chest pain, painful urination, hallucination.  Past Medical History:  Diagnosis Date  . Anxiety   . Arthritis   . Depression   . DM2 (diabetes mellitus, type 2) (HCC)   . GERD (gastroesophageal reflux disease)   . Hepatitis 1980's  . HTN (hypertension)   . Hypercholesteremia     Patient Active Problem List   Diagnosis Date Noted  . Osteopenia 11/27/2015  . DM 02/28/2007  . HYPERGLYCEMIA 02/11/2007  . LIVER FUNCTION TESTS, ABNORMAL 01/25/2007  . HYPERLIPIDEMIA 01/21/2007  . DEPRESSION 01/21/2007  . HYPERTENSION 01/21/2007  . GERD 01/21/2007  . DIZZINESS 01/21/2007  . DYSPNEA/SHORTNESS OF BREATH 01/21/2007    Past Surgical History:  Procedure Laterality Date  . ABDOMINAL HYSTERECTOMY     TAH/BSO  . BREAST  BIOPSY    . hysterectomy - unknown type      OB History    Gravida Para Term Preterm AB Living   4 4       4    SAB TAB Ectopic Multiple Live Births                   Home Medications    Prior to Admission medications   Medication Sig Start Date End Date Taking? Authorizing Provider  amLODipine (NORVASC) 10 MG tablet Take 10 mg by mouth daily.    Historical Provider, MD  atorvastatin (LIPITOR) 10 MG tablet Take 10 mg by mouth daily.    Historical Provider, MD  buPROPion (WELLBUTRIN XL) 150 MG 24 hr tablet Take 150 mg by mouth daily.    Historical Provider, MD  butalbital-acetaminophen-caffeine (FIORICET, ESGIC) 50-325-40 MG per tablet Take 1 tablet by mouth 2 (two) times daily as needed for headache.    Historical Provider, MD  glimepiride (AMARYL) 4 MG tablet Take 4 mg by mouth daily before breakfast.    Historical Provider, MD  lisinopril-hydrochlorothiazide (PRINZIDE,ZESTORETIC) 20-25 MG per tablet Take 1 tablet by mouth daily.    Historical Provider, MD  metFORMIN (GLUCOPHAGE-XR) 500 MG 24 hr tablet Take 1,500 mg by mouth daily with breakfast.     Historical Provider, MD  mirtazapine (REMERON) 15 MG tablet Take 15 mg by mouth at bedtime.    Historical Provider, MD  Multiple Vitamin (MULTIVITAMIN WITH MINERALS) TABS Take 1 tablet by mouth daily.    Historical Provider, MD  traZODone (DESYREL) 50 MG tablet Take 50 mg by mouth at bedtime.    Historical Provider, MD    Family History Family History  Problem Relation Age of Onset  . Cancer    . Stroke    . Cirrhosis    . Diabetes    . Hyperlipidemia    . Cancer Father     LUNG?  . Diabetes Paternal Grandmother   . Hypertension Paternal Grandmother   . Stroke Paternal Grandmother     Social History Social History  Substance Use Topics  . Smoking status: Never Smoker  . Smokeless tobacco: Never Used  . Alcohol use No     Comment: rarely     Allergies   Aspirin and Vicodin [hydrocodone-acetaminophen]   Review of  Systems Review of Systems 10 Systems reviewed and are negative for acute change except as noted in the HPI.   Physical Exam Updated Vital Signs BP 135/89 (BP Location: Left Arm)   Pulse (!) 125   Resp 20   Ht 4\' 9"  (1.448 m)   Wt 156 lb (70.8 kg)   SpO2 95%   BMI 33.76 kg/m   Physical Exam  Constitutional: She is oriented to person, place, and time. She appears well-developed and well-nourished.  HENT:  Head: Normocephalic and atraumatic.  Cardiovascular: Regular rhythm.   No murmur heard. Tachycardic  Pulmonary/Chest: Effort normal and breath sounds normal. No respiratory distress.  Abdominal: Soft. There is no tenderness. There is no rebound and no guarding.  Musculoskeletal: She exhibits no edema or tenderness.  Neurological: She is alert and oriented to person, place, and time.  She is tremulous in all 4 extremities  Skin: Skin is warm and dry. Capillary refill takes less than 2 seconds.  Psychiatric:  Anxious  Nursing note and vitals reviewed.   ED Treatments / Results  DIAGNOSTIC STUDIES:  COORDINATION OF CARE: 4:11 AM Discussed treatment plan with pt at bedside which includes labs, imaging and pt agreed to plan.  Labs (all labs ordered are listed, but only abnormal results are displayed) Labs Reviewed  CBC WITH DIFFERENTIAL/PLATELET - Abnormal; Notable for the following:       Result Value   WBC 11.7 (*)    RBC 5.22 (*)    Neutro Abs 8.7 (*)    All other components within normal limits  CBG MONITORING, ED - Abnormal; Notable for the following:    Glucose-Capillary 213 (*)    All other components within normal limits  COMPREHENSIVE METABOLIC PANEL  ETHANOL  URINALYSIS, ROUTINE W REFLEX MICROSCOPIC (NOT AT [facility])  I-STAT TROPOININ, ED    EKG  EKG Interpretation  Date/Time:  Monday December 09 2015 02:43:04 EDT Ventricular Rate:  123 PR Interval:    QRS Duration: 89 QT Interval:  350 QTC Calculation: 501 R Axis:   -46 Text Interpretation:  Atrial  fibrillation Paired ventricular premature complexes Aberrant conduction of SV complex(es) Left anterior fascicular block Anterior infarct, old Nonspecific T abnormalities, lateral leads Prolonged QT interval Confirmed by Lincoln Brigham [phone]) on 12/09/2015 4:01:07 AM       Radiology No results found.  Procedures Procedures (including critical care time)  Medications Ordered in ED Medications  ondansetron (ZOFRAN) injection 4 mg (4 mg Intravenous Given 12/09/15 0311)  LORazepam (ATIVAN) injection 1 mg (1 mg Intravenous Given 12/09/15 0311)  sodium chloride 0.9 % bolus 1,000 mL (0 mLs Intravenous Stopped 12/09/15 0358)     Initial Impression / Assessment and Plan / ED Course  I  have reviewed the triage vital signs and the nursing notes.  Pertinent labs & imaging results that were available during my care of the patient were reviewed by me and considered in my medical decision making (see chart for details).  Clinical Course    Patient here for evaluation of tremors and shakiness, vomiting prior to ED arrival. She is anxious and tremulous in the emergency department with tachycardia. She is nontoxic appearing. She was provided Ativan for potential anxiety with no significant change in her exam or heart rate. She does state she feels improved following the Ativan. Hospitalist was contacted for admission for change in mental status with tachycardia. Patient and family in agreement with admission.  Final Clinical Impressions(s) / ED Diagnoses   Final diagnoses:  Acute confusional state    New Prescriptions New Prescriptions   No medications on file   I personally performed the services described in this documentation, which was scribed in my presence. The recorded information has been reviewed and is accurate.     Tilden FossaElizabeth Vyolet Sakuma, MD 12/09/15 [phone]

## 2015-12-09 NOTE — ED Notes (Signed)
Dr. Merrell at bedside. 

## 2015-12-09 NOTE — ED Notes (Signed)
Pt attempting to use bedpan at the time.

## 2015-12-09 NOTE — ED Notes (Signed)
Pt voided in bed. Cleaned up and placed in new gown. Pads placed under her.

## 2015-12-09 NOTE — Progress Notes (Signed)
Inpatient Diabetes Program Recommendations  AACE/ADA: New Consensus Statement on Inpatient Glycemic Control (2015)  Target Ranges:  Prepandial:   less than 140 mg/dL      Peak postprandial:   less than 180 mg/dL (1-2 hours)      Critically ill patients:  140 - 180 mg/dL   Results for [name], Amber Patton (MRN [id number]) as of 12/09/2015 09:05  Ref. Range 12/09/2015 03:00  Glucose Latest Ref Range: 65 - 99 mg/dL 696216 (H)   Review of Glycemic Control  Diabetes history: DM2 Outpatient Diabetes medications: Amaryl 4 mg QAM, Metformin XR 1500 mg QAM Current orders for Inpatient glycemic control: None; temporary admission orders at this time  Inpatient Diabetes Program Recommendations: Correction (SSI): Please consider ordering CBGs with Novolog correction scale ACHS. HgbA1C: Please consider ordering an A1C to evaluate glycemic control over the past 2-3 months.  Thanks, Orlando PennerMarie Kalieb Freeland, RN, MSN, CDE Diabetes Coordinator Inpatient Diabetes Program [phone] (Team Pager from 8am to 5pm) [phone] ([facility] office) [phone] [facility] office) [phone] [facility] office)

## 2015-12-10 ENCOUNTER — Inpatient Hospital Stay (HOSPITAL_COMMUNITY): Payer: Medicare Other

## 2015-12-10 DIAGNOSIS — I4891 Unspecified atrial fibrillation: Secondary | ICD-10-CM

## 2015-12-10 DIAGNOSIS — I471 Supraventricular tachycardia: Secondary | ICD-10-CM

## 2015-12-10 LAB — CBC
HEMATOCRIT: 37.6 % (ref 36.0–46.0)
Hemoglobin: 12.7 g/dL (ref 12.0–15.0)
MCH: 28.4 pg (ref 26.0–34.0)
MCHC: 33.8 g/dL (ref 30.0–36.0)
MCV: 84.1 fL (ref 78.0–100.0)
PLATELETS: 227 10*3/uL (ref 150–400)
RBC: 4.47 MIL/uL (ref 3.87–5.11)
RDW: 13.2 % (ref 11.5–15.5)
WBC: 8.7 10*3/uL (ref 4.0–10.5)

## 2015-12-10 LAB — LIPID PANEL
CHOL/HDL RATIO: 3.6 ratio
Cholesterol: 162 mg/dL (ref 0–200)
HDL: 45 mg/dL (ref >40)
LDL CALC: 73 mg/dL (ref 0–99)
Triglycerides: 219 mg/dL — ABNORMAL HIGH (ref <150)
VLDL: 44 mg/dL — AB (ref 0–40)

## 2015-12-10 LAB — COMPREHENSIVE METABOLIC PANEL
ALBUMIN: 3.9 g/dL (ref 3.5–5.0)
ALT: 19 U/L (ref 14–54)
AST: 14 U/L — AB (ref 15–41)
Alkaline Phosphatase: 81 U/L (ref 38–126)
Anion gap: 9 (ref 5–15)
BUN: 13 mg/dL (ref 6–20)
CHLORIDE: 106 mmol/L (ref 101–111)
CO2: 24 mmol/L (ref 22–32)
CREATININE: 0.64 mg/dL (ref 0.44–1.00)
Calcium: 8.9 mg/dL (ref 8.9–10.3)
GFR calc Af Amer: >60 mL/min (ref >60)
GLUCOSE: 124 mg/dL — AB (ref 65–99)
Potassium: 3.6 mmol/L (ref 3.5–5.1)
Sodium: 139 mmol/L (ref 135–145)
Total Bilirubin: 0.4 mg/dL (ref 0.3–1.2)
Total Protein: 6.7 g/dL (ref 6.5–8.1)

## 2015-12-10 LAB — BASIC METABOLIC PANEL
Anion gap: 8 (ref 5–15)
BUN: 10 mg/dL (ref 6–20)
CHLORIDE: 108 mmol/L (ref 101–111)
CO2: 26 mmol/L (ref 22–32)
Calcium: 8.8 mg/dL — ABNORMAL LOW (ref 8.9–10.3)
Creatinine, Ser: 0.7 mg/dL (ref 0.44–1.00)
GFR calc Af Amer: >60 mL/min (ref >60)
GFR calc non Af Amer: >60 mL/min (ref >60)
GLUCOSE: 155 mg/dL — AB (ref 65–99)
POTASSIUM: 3.9 mmol/L (ref 3.5–5.1)
Sodium: 142 mmol/L (ref 135–145)

## 2015-12-10 LAB — RAPID URINE DRUG SCREEN, HOSP PERFORMED
Amphetamines: NOT DETECTED
BENZODIAZEPINES: NOT DETECTED
Barbiturates: NOT DETECTED
COCAINE: NOT DETECTED
Opiates: NOT DETECTED
Tetrahydrocannabinol: NOT DETECTED

## 2015-12-10 LAB — GLUCOSE, CAPILLARY
GLUCOSE-CAPILLARY: 95 mg/dL (ref 65–99)
GLUCOSE-CAPILLARY: 211 mg/dL — AB (ref 65–99)

## 2015-12-10 LAB — ECHOCARDIOGRAM COMPLETE
Height: 57 in
WEIGHTICAEL: 2472 [oz_av]

## 2015-12-10 LAB — TROPONIN I: Troponin I: <0.03 ng/mL (ref <0.03)

## 2015-12-10 LAB — T3, FREE: T3, Free: 2.2 pg/mL (ref 2.0–4.4)

## 2015-12-10 LAB — HEMOGLOBIN A1C
Hgb A1c MFr Bld: 6 % — ABNORMAL HIGH (ref 4.8–5.6)
MEAN PLASMA GLUCOSE: 126 mg/dL

## 2015-12-10 LAB — MAGNESIUM: MAGNESIUM: 1.8 mg/dL (ref 1.7–2.4)

## 2015-12-10 LAB — HEPARIN LEVEL (UNFRACTIONATED): HEPARIN UNFRACTIONATED: 0.35 [IU]/mL (ref 0.30–0.70)

## 2015-12-10 MED ORDER — LISINOPRIL 20 MG PO TABS: 20.0000 mg | ORAL_TABLET | Freq: Every day | ORAL | 0 refills | Status: AC

## 2015-12-10 MED ORDER — ENOXAPARIN SODIUM 40 MG/0.4ML ~~LOC~~ SOLN
40.0000 mg | SUBCUTANEOUS | Status: DC
  Administered 2015-12-10: 40 mg via SUBCUTANEOUS
  Filled 2015-12-10: qty 0.4

## 2015-12-10 MED ORDER — POTASSIUM CHLORIDE CRYS ER 20 MEQ PO TBCR
40.0000 meq | EXTENDED_RELEASE_TABLET | Freq: Once | ORAL | Status: AC
  Administered 2015-12-10: 40 meq via ORAL
  Filled 2015-12-10: qty 2

## 2015-12-10 NOTE — Progress Notes (Signed)
  Echocardiogram 2D Echocardiogram has been performed.  Janalyn HarderWest, Marvelle Caudill R 12/10/2015, 2:01 PM

## 2015-12-10 NOTE — Progress Notes (Addendum)
Discharge education reviewed with patient and family using teach back. No questions at this time. Awaiting results of patient's echo to be discharged.

## 2015-12-10 NOTE — Progress Notes (Signed)
Educated the patient again on the importance of going to her follow up appointments. Spoke with Cardiologist who stated patient's echo has been read. Echo results not crossing over into epic. Dr Roda ShuttersXu updated. OK to discharge patient home with husband.

## 2015-12-10 NOTE — Progress Notes (Signed)
ANTICOAGULATION CONSULT NOTE - Follow Up Consult  Pharmacy Consult for Heparin Indication: atrial fibrillation  Allergies  Allergen Reactions  . Aspirin Other (See Comments)    Reaction unknown  . Vicodin [Hydrocodone-Acetaminophen] Other (See Comments)    Reaction unknown    Patient Measurements: Height: 4\' 9"  (144.8 cm) Weight: 154 lb 8 oz (70.1 kg) IBW/kg (Calculated) : 38.6 Heparin Dosing Weight: 55kg  Vital Signs: Temp: 98.6 F (37 C) (08/29 0823) Temp Source: Oral (08/29 0823) BP: 105/68 (08/29 0749) Pulse Rate: 82 (08/29 0823)  Labs:  Recent Labs  12/09/15 0300 12/09/15 0926 12/09/15 1833 12/09/15 2017 12/10/15 0054  HGB 14.8  --   --   --  12.7  HCT 43.8  --   --   --  37.6  PLT 303  --   --   --  227  HEPARINUNFRC  --   --   --  0.30 0.35  CREATININE 0.71  --   --   --  0.64  TROPONINI  --  <0.03 <0.03  --  <0.03    Estimated Creatinine Clearance: 52.9 mL/min (by C-G formula based on SCr of 0.8 mg/dL).   Medications:  Heparin @ 800 units/hr  Assessment: 70yof on heparin for possible new onset Afib. HL today therapeutic at 0.35. CBC WNL and stable. No bleeding reported.    Goal of Therapy:  Heparin level 0.3-0.7 Monitor platelets by anticoagulation protocol: Yes   Plan:  Plan: -Continue heparin at 900 units/hr -Daily HL/CBC -F/U cardio work-up, [facility]   Sherle Poeob Vincent, PharmD Clinical Pharmacist 8:46 AM, 12/10/2015

## 2015-12-10 NOTE — Progress Notes (Signed)
Attempted to schedule patients follow up appointments.

## 2015-12-10 NOTE — Progress Notes (Signed)
Subjective:  Feeling better this AM. No CP/SOB. Less anxious. Wants to go home  Objective:  Temp:  [97.8 F (36.6 C)-98.6 F (37 C)] 98.6 F (37 C) (08/29 0823) Pulse Rate:  [79-128] 82 (08/29 0823) Resp:  [17-34] 18 (08/29 0749) BP: (89-129)/(55-87) 105/68 (08/29 0749) SpO2:  [90 %-100 %] 90 % (08/29 0823) Weight:  [154 lb 8 oz (70.1 kg)] 154 lb 8 oz (70.1 kg) (08/29 0539) Weight change: -1 lb 8 oz (-0.68 kg)  Intake/Output from previous day: 08/28 0701 - 08/29 0700 In: 1126.3 [P.O.:440; I.V.:686.3] Out: 400 [Urine:400]  Intake/Output from this shift: No intake/output data recorded.  Physical Exam: General appearance: alert and no distress Neck: no adenopathy, no carotid bruit, no JVD, supple, symmetrical, trachea midline and thyroid not enlarged, symmetric, no tenderness/mass/nodules Lungs: clear to auscultation bilaterally Heart: regular rate and rhythm, S1, S2 normal, no murmur, click, rub or gallop Extremities: extremities normal, atraumatic, no cyanosis or edema  Lab Results: Results for orders placed or performed during the hospital encounter of 12/09/15 (from the past 48 hour(s))  CBG monitoring, ED     Status: Abnormal   Collection Time: 12/09/15  2:41 AM  Result Value Ref Range   Glucose-Capillary 213 (H) 65 - 99 mg/dL   Comment 1 Notify RN    Comment 2 Call MD NNP PA CNM    Comment 3 Document in Chart   Comprehensive metabolic panel     Status: Abnormal   Collection Time: 12/09/15  3:00 AM  Result Value Ref Range   Sodium 135 135 - 145 mmol/L   Potassium 3.4 (L) 3.5 - 5.1 mmol/L   Chloride 98 (L) 101 - 111 mmol/L   CO2 19 (L) 22 - 32 mmol/L   Glucose, Bld 216 (H) 65 - 99 mg/dL   BUN 10 6 - 20 mg/dL   Creatinine, Ser 0.71 0.44 - 1.00 mg/dL   Calcium 10.4 (H) 8.9 - 10.3 mg/dL   Total Protein 8.0 6.5 - 8.1 g/dL   Albumin 4.9 3.5 - 5.0 g/dL   AST 21 15 - 41 U/L   ALT 23 14 - 54 U/L   Alkaline Phosphatase 107 38 - 126 U/L   Total Bilirubin 0.6 0.3  - 1.2 mg/dL   GFR calc non Af Amer >60 >60 mL/min   GFR calc Af Amer >60 >60 mL/min    Comment: (NOTE) The eGFR has been calculated using the CKD EPI equation. This calculation has not been validated in all clinical situations. eGFR's persistently <60 mL/min signify possible Chronic Kidney Disease.    Anion gap 18 (H) 5 - 15  CBC with Differential     Status: Abnormal   Collection Time: 12/09/15  3:00 AM  Result Value Ref Range   WBC 11.7 (H) 4.0 - 10.5 K/uL   RBC 5.22 (H) 3.87 - 5.11 MIL/uL   Hemoglobin 14.8 12.0 - 15.0 g/dL   HCT 43.8 36.0 - 46.0 %   MCV 83.9 78.0 - 100.0 fL   MCH 28.4 26.0 - 34.0 pg   MCHC 33.8 30.0 - 36.0 g/dL   RDW 12.8 11.5 - 15.5 %   Platelets 303 150 - 400 K/uL   Neutrophils Relative % 74 %   Neutro Abs 8.7 (H) 1.7 - 7.7 K/uL   Lymphocytes Relative 18 %   Lymphs Abs 2.2 0.7 - 4.0 K/uL   Monocytes Relative 7 %   Monocytes Absolute 0.8 0.1 - 1.0 K/uL   Eosinophils  Relative 1 %   Eosinophils Absolute 0.1 0.0 - 0.7 K/uL   Basophils Relative 0 %   Basophils Absolute 0.0 0.0 - 0.1 K/uL  I-stat troponin, ED     Status: None   Collection Time: 12/09/15  3:16 AM  Result Value Ref Range   Troponin i, poc 0.00 0.00 - 0.08 ng/mL   Comment 3            Comment: Due to the release kinetics of cTnI, a negative result within the first hours of the onset of symptoms does not rule out myocardial infarction with certainty. If myocardial infarction is still suspected, repeat the test at appropriate intervals.   Urinalysis, Routine w reflex microscopic     Status: Abnormal   Collection Time: 12/09/15  5:29 AM  Result Value Ref Range   Color, Urine YELLOW YELLOW   APPearance CLEAR CLEAR   Specific Gravity, Urine 1.015 1.005 - 1.030   pH 6.5 5.0 - 8.0   Glucose, UA NEGATIVE NEGATIVE mg/dL   Hgb urine dipstick TRACE (A) NEGATIVE   Bilirubin Urine NEGATIVE NEGATIVE   Ketones, ur 15 (A) NEGATIVE mg/dL   Protein, ur NEGATIVE NEGATIVE mg/dL   Nitrite NEGATIVE  NEGATIVE   Leukocytes, UA MODERATE (A) NEGATIVE  Urine microscopic-add on     Status: Abnormal   Collection Time: 12/09/15  5:29 AM  Result Value Ref Range   Squamous Epithelial / LPF 0-5 (A) NONE SEEN   WBC, UA 6-30 0 - 5 WBC/hpf   RBC / HPF 0-5 0 - 5 RBC/hpf   Bacteria, UA RARE (A) NONE SEEN  Ethanol     Status: None   Collection Time: 12/09/15  7:29 AM  Result Value Ref Range   Alcohol, Ethyl (B) <5 <5 mg/dL    Comment:        LOWEST DETECTABLE LIMIT FOR SERUM ALCOHOL IS 5 mg/dL FOR MEDICAL PURPOSES ONLY   TSH     Status: None   Collection Time: 12/09/15  9:26 AM  Result Value Ref Range   TSH 1.900 0.350 - 4.500 uIU/mL  T4, free     Status: None   Collection Time: 12/09/15  9:26 AM  Result Value Ref Range   Free T4 0.76 0.61 - 1.12 ng/dL    Comment: (NOTE) Biotin ingestion may interfere with free T4 tests. If the results are inconsistent with the TSH level, previous test results, or the clinical presentation, then consider biotin interference. If needed, order repeat testing after stopping biotin.   T3, free     Status: None   Collection Time: 12/09/15  9:26 AM  Result Value Ref Range   T3, Free 2.2 2.0 - 4.4 pg/mL    Comment: (NOTE) Performed At: [facility] [place], [place] [id number] Lindon Romp MD DB:[id number]   Magnesium     Status: Abnormal   Collection Time: 12/09/15  9:26 AM  Result Value Ref Range   Magnesium 1.4 (L) 1.7 - 2.4 mg/dL  Phosphorus     Status: None   Collection Time: 12/09/15  9:26 AM  Result Value Ref Range   Phosphorus 3.5 2.5 - 4.6 mg/dL  Hemoglobin A1c     Status: Abnormal   Collection Time: 12/09/15  9:26 AM  Result Value Ref Range   Hgb A1c MFr Bld 6.0 (H) 4.8 - 5.6 %    Comment: (NOTE)         Pre-diabetes: 5.7 - 6.4  Diabetes: >6.4         Glycemic control for adults with diabetes: <7.0    Mean Plasma Glucose 126 mg/dL    Comment: (NOTE) Performed At: [facility] [place], [place] [id number] Lindon Romp MD [id number]   Troponin I     Status: None   Collection Time: 12/09/15  9:26 AM  Result Value Ref Range   Troponin I <0.03 <0.03 ng/mL  Glucose, capillary     Status: None   Collection Time: 12/09/15 11:39 AM  Result Value Ref Range   Glucose-Capillary 98 65 - 99 mg/dL  MRSA PCR Screening     Status: None   Collection Time: 12/09/15  1:55 PM  Result Value Ref Range   MRSA by PCR NEGATIVE NEGATIVE    Comment:        The GeneXpert MRSA Assay (FDA approved for NASAL specimens only), is one component of a comprehensive MRSA colonization surveillance program. It is not intended to diagnose MRSA infection nor to guide or monitor treatment for MRSA infections.   Glucose, capillary     Status: Abnormal   Collection Time: 12/09/15  6:11 PM  Result Value Ref Range   Glucose-Capillary 126 (H) 65 - 99 mg/dL  Troponin I     Status: None   Collection Time: 12/09/15  6:33 PM  Result Value Ref Range   Troponin I <0.03 <0.03 ng/mL  Heparin level (unfractionated)     Status: None   Collection Time: 12/09/15  8:17 PM  Result Value Ref Range   Heparin Unfractionated 0.30 0.30 - 0.70 IU/mL    Comment:        IF HEPARIN RESULTS ARE BELOW EXPECTED VALUES, AND PATIENT DOSAGE HAS BEEN CONFIRMED, SUGGEST FOLLOW UP TESTING OF ANTITHROMBIN III LEVELS.   Glucose, capillary     Status: Abnormal   Collection Time: 12/09/15  8:41 PM  Result Value Ref Range   Glucose-Capillary 155 (H) 65 - 99 mg/dL  CBC     Status: None   Collection Time: 12/10/15 12:54 AM  Result Value Ref Range   WBC 8.7 4.0 - 10.5 K/uL   RBC 4.47 3.87 - 5.11 MIL/uL   Hemoglobin 12.7 12.0 - 15.0 g/dL   HCT 37.6 36.0 - 46.0 %   MCV 84.1 78.0 - 100.0 fL   MCH 28.4 26.0 - 34.0 pg   MCHC 33.8 30.0 - 36.0 g/dL   RDW 13.2 11.5 - 15.5 %   Platelets 227 150 - 400 K/uL  Comprehensive metabolic panel     Status: Abnormal   Collection Time: 12/10/15 12:54 AM  Result Value Ref  Range   Sodium 139 135 - 145 mmol/L   Potassium 3.6 3.5 - 5.1 mmol/L   Chloride 106 101 - 111 mmol/L   CO2 24 22 - 32 mmol/L   Glucose, Bld 124 (H) 65 - 99 mg/dL   BUN 13 6 - 20 mg/dL   Creatinine, Ser 0.64 0.44 - 1.00 mg/dL   Calcium 8.9 8.9 - 10.3 mg/dL   Total Protein 6.7 6.5 - 8.1 g/dL   Albumin 3.9 3.5 - 5.0 g/dL   AST 14 (L) 15 - 41 U/L   ALT 19 14 - 54 U/L   Alkaline Phosphatase 81 38 - 126 U/L   Total Bilirubin 0.4 0.3 - 1.2 mg/dL   GFR calc non Af Amer >60 >60 mL/min   GFR calc Af Amer >60 >60 mL/min    Comment: (NOTE) The  eGFR has been calculated using the CKD EPI equation. This calculation has not been validated in all clinical situations. eGFR's persistently <60 mL/min signify possible Chronic Kidney Disease.    Anion gap 9 5 - 15  Heparin level (unfractionated)     Status: None   Collection Time: 12/10/15 12:54 AM  Result Value Ref Range   Heparin Unfractionated 0.35 0.30 - 0.70 IU/mL    Comment:        IF HEPARIN RESULTS ARE BELOW EXPECTED VALUES, AND PATIENT DOSAGE HAS BEEN CONFIRMED, SUGGEST FOLLOW UP TESTING OF ANTITHROMBIN III LEVELS.   Lipid panel     Status: Abnormal   Collection Time: 12/10/15 12:54 AM  Result Value Ref Range   Cholesterol 162 0 - 200 mg/dL   Triglycerides 219 (H) <150 mg/dL   HDL 45 >40 mg/dL   Total CHOL/HDL Ratio 3.6 RATIO   VLDL 44 (H) 0 - 40 mg/dL   LDL Cholesterol 73 0 - 99 mg/dL    Comment:        Total Cholesterol/HDL:CHD Risk Coronary Heart Disease Risk Table                     Men   Women  1/2 Average Risk   3.4   3.3  Average Risk       5.0   4.4  2 X Average Risk   9.6   7.1  3 X Average Risk  23.4   11.0        Use the calculated Patient Ratio above and the CHD Risk Table to determine the patient's CHD Risk.        ATP III CLASSIFICATION (LDL):  <100     mg/dL   Optimal  100-129  mg/dL   Near or Above                    Optimal  130-159  mg/dL   Borderline  160-189  mg/dL   High  >190     mg/dL   Very  High   Troponin I     Status: None   Collection Time: 12/10/15 12:54 AM  Result Value Ref Range   Troponin I <0.03 <0.03 ng/mL  Glucose, capillary     Status: None   Collection Time: 12/10/15  7:47 AM  Result Value Ref Range   Glucose-Capillary 95 65 - 99 mg/dL    Imaging: Imaging results have been reviewed (NAD)  Tele- NSR 80s  Assessment/Plan:   1. Principal Problem: 2.   Atrial fibrillation with rapid ventricular response ([place]) 3. Active Problems: 4.   Confusion 5.   Tremor 6.   Diabetes mellitus with complication ([place]) 7.   HLD (hyperlipidemia) 8.   UTI (urinary tract infection) 9.   Blurry vision 10.   Depression with anxiety 11.   Insomnia 12.   Time Spent Directly with Patient:  20 minutes  Length of Stay:  LOS: 1 day   Rhythm better this AM. Clearly NSR. Feeling clinically improved. Trop neg. Exam benign. Cause of elevated HR still not clear. I don't think it was afib. Will DC IV hep and Dilt. Check 2D. No further cardiac w/u necessary at this time.  Quay Burow 12/10/2015, 10:17 AM

## 2015-12-10 NOTE — Care Management Note (Signed)
Case Management Note  Patient Details  Name: Amber Patton MRN: [id number] Date of Birth: 04-24-1945  Subjective/Objective:     afib with RVR               Action/Plan: Discharge Planning: AVS reviewed: NCM spoke to pt and dtr at bedside. HH PT not recommended. Pt states he lives at home with husband. Husband and dtr will assist her as needed. She is independent at home. No issues with affording medications.   PCP- Amber Patton  Expected Discharge Date:  12/10/2015              Expected Discharge Plan:  Home/Self Care  In-House Referral:  NA  Discharge planning Services  CM Consult  Post Acute Care Choice:  NA Choice offered to:     DME Arranged:  N/A DME Agency:  NA  HH Arranged:  NA HH Agency:  NA  Status of Service:  Completed, signed off  If discussed at Long Length of Stay Meetings, dates discussed:    Additional Comments:  Amber Patton, Amber Merrihew Ellen, RN 12/10/2015, 6:20 PM

## 2015-12-10 NOTE — Discharge Summary (Signed)
Discharge Summary  Amber Patton ZOX:[id number] DOB: Mar 05, 1946  PCP: Lavell Islam, MD  Admit date: 12/09/2015 Discharge date: 12/10/2015  Time spent: >39mins  Recommendations for Outpatient Follow-up:  1. F/u with PMD within a week  for hospital discharge follow up, repeat cbc/bmp at follow up, pmd to follow up on final echocardiogram result. 2. Outpatient event monitor for tachycardia  Discharge Diagnoses:  Active Hospital Problems   Diagnosis Date Noted  . Atrial fibrillation with rapid ventricular response (HCC) 12/09/2015  . Confusion 12/09/2015  . Tremor 12/09/2015  . Diabetes mellitus with complication (HCC) 12/09/2015  . HLD (hyperlipidemia) 12/09/2015  . UTI (urinary tract infection) 12/09/2015  . Blurry vision 12/09/2015  . Depression with anxiety 12/09/2015  . Insomnia 12/09/2015    Resolved Hospital Problems   Diagnosis Date Noted Date Resolved  No resolved problems to display.    Discharge Condition: stable  Diet recommendation: heart healthy/carb modified  Filed Weights   12/09/15 0309 12/10/15 0539  Weight: 70.8 kg (156 lb) 70.1 kg (154 lb 8 oz)    History of present illness:  Chief Complaint: tremor and blurry vision   HPI: Amber Patton is a 70 y.o. female with medical history significant of panick attacks, depression, DM, GERD, HTN, Hepatitis, HLD, presenting w/ blurry vision, and tremor and garbled speech. Of note patient's husband and daughter are acting as her interpreter as her English is limited. Patient reports waking up on 12/08/2015 with a sensation of tremor and shakiness in her hands along with intermittent episodes of blurry vision and vision loss. Her vision would last only a matter of seconds. Patient also had a couple of episodes over the course the day of garbled speech per her husband. Patient also endorses headache described as "shocking or pulling of the brain. " Over the course of the day symptoms slowly improved however just  before bedtime symptoms significantly worse and she came to the emergency room. Symptoms improved with rest. No aggravating factors. When symptoms worsened overnight she endorse 1 episode of nonbilious non-bloody emesis. Of note patient does endorse having daily panic attacks when she first wakes up in the morning. Denies any dysuria, frequency, chest pain, palpitations, back pain, diarrhea, constipation, LOC.   ED Course: Objective findings below. Ativan given w/ only minimal relief of overal subjective symptoms.   Hospital Course:  Principal Problem:   Atrial fibrillation with rapid ventricular response (HCC) Active Problems:   Confusion   Tremor   Diabetes mellitus with complication (HCC)   HLD (hyperlipidemia)   UTI (urinary tract infection)   Blurry vision   Depression with anxiety   Insomnia   Tachycardia:  Etiology not immediately clear. tsh wnl, Possible related to anxiety, she also reports that she drink energy drink twice a day.  Suspect intermittent Afib vs sinus tach.   - she is admitted to [facility] unit and was started on cardizem drip due to concerning for afib, cardiology consulted, recommend to stop cardizem drip due to less likely afib -  Echo result pending at time of discharge, pmd to follow up on final result. -  Cards consult  Consulted, she is cleared from cardiology stand point -she is scheduled to have outpatient heart monitor   Abnormal urinalysis:  UA worriesome for infection but pt w/o classic symptoms of dysuria, frequency, flank pain, fever. Currently w/ tremor and leukocytosis and Tachycardia. Pt states she's never had a UTI - f/u UCX, and BCX no growth, she does not have symptom - she  is treated with Ceftriaxone in the hospital, no abx at discharge.  Tremor: likely from anxiety, resolved - Treatment as above  Weakness, HA, Blurry vision, garbled speech: likely related to anxiety,   CT head negative. MRI/MRA brain head  - Studies as above - MRI  brain/MRA head/neck unremarkable - PT cleared patient , no need of further PT intervention -UDS negative.   noninsulin dependent DM: on orals only.  - home meds metformin held in the hospital, restarted at discharge - continue reglan for gastroparesis  HTN: low normal - was briefly on dilt drip initially for possible afib, this was stopped by cardiology - Resume Norvasc, lisiniporil,  D/c HCTZ ( patient is dehydrated, with low k and mag, she report does not like drinking water at all) She is instructed to do home bp monitoring and bring in recording to hospital discharge follow up for further blood pressure medication adjustment.  HLD: - continue lipitor  Insomnia: - contineu trazodone  Depression/Anxiety: - continue remeron, Wellbutrin - Ativan PRN -continue follow with outpatient psychiatry  DVT prophylaxis while in the hospital: Heparin  Code Status: FULL  Family Communication: husband and daughter  Disposition Plan: home  Consults called: Cards    Discharge Exam: BP 105/71 (BP Location: Left Arm)   Pulse 88   Temp 98.7 F (37.1 C) (Oral)   Resp 17   Ht 4\' 9"  (1.448 m)   Wt 70.1 kg (154 lb 8 oz)   SpO2 92%   BMI 33.43 kg/m   General: NAD Cardiovascular: RRR Respiratory: CTABL  Discharge Instructions You were cared for by a hospitalist during your hospital stay. If you have any questions about your discharge medications or the care you received while you were in the hospital after you are discharged, you can call the unit and asked to speak with the hospitalist on call if the hospitalist that took care of you is not available. Once you are discharged, your primary care physician will handle any further medical issues. Please note that NO REFILLS for any discharge medications will be authorized once you are discharged, as it is imperative that you return to your primary care physician (or establish a relationship with a primary care physician if you do not have  one) for your aftercare needs so that they can reassess your need for medications and monitor your lab values.  Discharge Instructions    Diet - low sodium heart healthy    Complete by:  As directed   Carb modified   Increase activity slowly    Complete by:  As directed       Medication List    STOP taking these medications   lisinopril-hydrochlorothiazide 20-25 MG tablet Commonly known as:  PRINZIDE,ZESTORETIC     TAKE these medications   amLODipine 10 MG tablet Commonly known as:  NORVASC Take 10 mg by mouth daily.   atorvastatin 10 MG tablet Commonly known as:  LIPITOR Take 10 mg by mouth daily.   buPROPion 150 MG 24 hr tablet Commonly known as:  WELLBUTRIN XL Take 450 mg by mouth daily.   butalbital-acetaminophen-caffeine 50-325-40 MG tablet Commonly known as:  FIORICET, ESGIC Take 1 tablet by mouth 2 (two) times daily as needed for headache.   glimepiride 4 MG tablet Commonly known as:  AMARYL Take 4 mg by mouth daily before breakfast.   lisinopril 20 MG tablet Commonly known as:  PRINIVIL,ZESTRIL Take 1 tablet (20 mg total) by mouth daily.   metFORMIN 500 MG 24 hr  tablet Commonly known as:  GLUCOPHAGE-XR Take 1,500 mg by mouth daily with breakfast.   metoCLOPramide 10 MG tablet Commonly known as:  REGLAN Take 5 mg by mouth 4 (four) times daily -  before meals and at bedtime.   mirtazapine 15 MG tablet Commonly known as:  REMERON Take 15 mg by mouth at bedtime.   traZODone 50 MG tablet Commonly known as:  DESYREL Take 50 mg by mouth at bedtime.      Allergies  Allergen Reactions  . Aspirin Other (See Comments)    Reaction unknown  . Vicodin [Hydrocodone-Acetaminophen] Other (See Comments)    Reaction unknown   Follow-up Information    CLOWARD,DAVIS L, MD Follow up in 1 week(s).   Specialty:  Internal Medicine Why:  hospital discharge follow up, repeat cbc/bmp at follow up. please check your blood pressure at home and bring in recording to your  doctor for blood pressure medication  adjustment. please follow up final echoresult with your primary care physician. Contact information: [address] [place] [address] [phone]        [facility] [employer].   Specialty:  Cardiology Why:  if your donot hear from cardiology in three day, please call them to set up outpatient heart monitor for a month. Contact information: [address] [place] [address] [phone]           The results of significant diagnostics from this hospitalization (including imaging, microbiology, ancillary and laboratory) are listed below for reference.    Significant Diagnostic Studies: Dg Chest 2 View  Result Date: 12/09/2015 CLINICAL DATA:  Shortness of breath. History of hypertension, diabetes. EXAM: CHEST  2 VIEW COMPARISON:  Chest radiograph January 21, 2007 FINDINGS: Cardiomediastinal silhouette is normal. Mild bronchitic changes. No pleural effusions or focal consolidations. Trachea projects midline and there is no pneumothorax. Soft tissue planes and included osseous structures are non-suspicious. Surgical clips in the included right abdomen compatible with cholecystectomy. IMPRESSION: Mild bronchitic changes. Electronically Signed   By: Awilda Metro M.D.   On: 12/09/2015 04:27   Ct Head Wo Contrast  Result Date: 12/09/2015 CLINICAL DATA:  Altered mental status. History of hypertension, hyperlipidemia and diabetes EXAM: CT HEAD WITHOUT CONTRAST TECHNIQUE: Contiguous axial images were obtained from the base of the skull through the vertex without intravenous contrast. COMPARISON:  None. FINDINGS: BRAIN: The ventricles and sulci are normal for age. No intraparenchymal hemorrhage, mass effect nor midline shift. Patchy supratentorial white matter hypodensities less than expected for patient's age, though non-specific are most compatible with chronic small vessel ischemic disease. No acute large  vascular territory infarcts. No abnormal extra-axial fluid collections. Basal cisterns are patent. VASCULAR: Mildly dense intracranial vessels suggests hemoconcentration. Mild calcific atherosclerosis of the carotid siphons appear SKULL: No skull fracture. No significant scalp soft tissue swelling. SINUSES/ORBITS: The included ocular globes and orbital contents are non-suspicious.The mastoid air-cells and included paranasal sinuses are well-aerated. OTHER: None. IMPRESSION: Negative CT HEAD for age. Electronically Signed   By: Awilda Metro M.D.   On: 12/09/2015 05:06   Mr Angiogram Neck W Wo Contrast  Result Date: 12/09/2015 CLINICAL DATA:  70 y/o F; presenting with blurry vision, headaches, tremor, and garbled speech. Past medical history of panic attacks, depression, diabetes, GERD, hypertension, hyperlipidemia, and hepatitis. EXAM: MRI HEAD WITHOUT AND WITH CONTRAST MRA HEAD WITHOUT CONTRAST MRA NECK WITHOUT AND WITH CONTRAST TECHNIQUE: Multiplanar, multiecho pulse sequences of the brain and surrounding structures were obtained without and with  intravenous contrast. Angiographic images of the Circle of Willis were obtained using MRA technique without intravenous contrast. Angiographic images of the neck were obtained using MRA technique without and with intravenous contrast. Carotid stenosis measurements (when applicable) are obtained utilizing NASCET criteria, using the distal internal carotid diameter as the denominator. CONTRAST:  15mL MULTIHANCE GADOBENATE DIMEGLUMINE 529 MG/ML IV SOLN COMPARISON:  None. FINDINGS: MRI HEAD FINDINGS Brain: No diffusion restriction to suggest acute infarct. No abnormal susceptibility hypointensity to indicate intracranial hemorrhage. Few nonspecific foci of T2 FLAIR hyperintensity in subcortical and periventricular white matter with frontal lobe predominance. Mild parenchymal volume loss. No focal mass effect. No abnormal enhancement. Extra-axial space: No  hydrocephalus. No extra-axial collection is identified. Proximal intracranial flow voids are maintained. Other: No abnormal signal of the paranasal sinuses. No abnormal signal of the mastoid air cells. Orbits are unremarkable. Calvarium is unremarkable. MRA HEAD FINDINGS Anterior circulation: Bilateral internal carotid arteries, MCA, and ACA are patent. No occlusion, aneurysm, dissection, or significant stenosis is identified. Posterior circulation: Bilateral vertebral arteries, the basilar artery, and bilateral posterior cerebral arteries are patent. No occlusion, aneurysm, dissection, or significant stenosis is identified. Anatomic variants: Diminutive left posterior communicating artery. No right posterior communicating artery or anterior communicating artery identified, hypoplastic or absent. MRA NECK FINDINGS Aortic arch: 3 vessel arch Right common carotid artery: Patent. Right internal carotid artery: Patent. Right vertebral artery: Patent. Left common carotid artery: Patent. Left Internal carotid artery: Patent. Left Vertebral artery: Patent. No occlusion, aneurysm, dissection, or significant stenosis is identified. IMPRESSION: 1. No acute intracranial abnormality is identified. 2. Mild chronic microvascular ischemic changes and parenchymal volume loss. 3. Patent circle of Willis. No occlusion, aneurysm, dissection, or significant stenosis is identified. 4. Carotid and vertebral arteries of the neck are patent. No occlusion, aneurysm, dissection, or significant stenosis is identified. Electronically Signed   By: Mitzi HansenLance  Furusawa-Stratton M.D.   On: 12/09/2015 18:13   Mr Laqueta JeanBrain W ZOWo Contrast  Result Date: 12/09/2015 CLINICAL DATA:  70 y/o F; presenting with blurry vision, headaches, tremor, and garbled speech. Past medical history of panic attacks, depression, diabetes, GERD, hypertension, hyperlipidemia, and hepatitis. EXAM: MRI HEAD WITHOUT AND WITH CONTRAST MRA HEAD WITHOUT CONTRAST MRA NECK WITHOUT AND  WITH CONTRAST TECHNIQUE: Multiplanar, multiecho pulse sequences of the brain and surrounding structures were obtained without and with intravenous contrast. Angiographic images of the Circle of Willis were obtained using MRA technique without intravenous contrast. Angiographic images of the neck were obtained using MRA technique without and with intravenous contrast. Carotid stenosis measurements (when applicable) are obtained utilizing NASCET criteria, using the distal internal carotid diameter as the denominator. CONTRAST:  15mL MULTIHANCE GADOBENATE DIMEGLUMINE 529 MG/ML IV SOLN COMPARISON:  None. FINDINGS: MRI HEAD FINDINGS Brain: No diffusion restriction to suggest acute infarct. No abnormal susceptibility hypointensity to indicate intracranial hemorrhage. Few nonspecific foci of T2 FLAIR hyperintensity in subcortical and periventricular white matter with frontal lobe predominance. Mild parenchymal volume loss. No focal mass effect. No abnormal enhancement. Extra-axial space: No hydrocephalus. No extra-axial collection is identified. Proximal intracranial flow voids are maintained. Other: No abnormal signal of the paranasal sinuses. No abnormal signal of the mastoid air cells. Orbits are unremarkable. Calvarium is unremarkable. MRA HEAD FINDINGS Anterior circulation: Bilateral internal carotid arteries, MCA, and ACA are patent. No occlusion, aneurysm, dissection, or significant stenosis is identified. Posterior circulation: Bilateral vertebral arteries, the basilar artery, and bilateral posterior cerebral arteries are patent. No occlusion, aneurysm, dissection, or significant stenosis is identified. Anatomic variants: Diminutive left  posterior communicating artery. No right posterior communicating artery or anterior communicating artery identified, hypoplastic or absent. MRA NECK FINDINGS Aortic arch: 3 vessel arch Right common carotid artery: Patent. Right internal carotid artery: Patent. Right vertebral  artery: Patent. Left common carotid artery: Patent. Left Internal carotid artery: Patent. Left Vertebral artery: Patent. No occlusion, aneurysm, dissection, or significant stenosis is identified. IMPRESSION: 1. No acute intracranial abnormality is identified. 2. Mild chronic microvascular ischemic changes and parenchymal volume loss. 3. Patent circle of Willis. No occlusion, aneurysm, dissection, or significant stenosis is identified. 4. Carotid and vertebral arteries of the neck are patent. No occlusion, aneurysm, dissection, or significant stenosis is identified. Electronically Signed   By: Mitzi Hansen M.D.   On: 12/09/2015 18:13   Mr Maxine Glenn Headm  Result Date: 12/09/2015 CLINICAL DATA:  70 y/o F; presenting with blurry vision, headaches, tremor, and garbled speech. Past medical history of panic attacks, depression, diabetes, GERD, hypertension, hyperlipidemia, and hepatitis. EXAM: MRI HEAD WITHOUT AND WITH CONTRAST MRA HEAD WITHOUT CONTRAST MRA NECK WITHOUT AND WITH CONTRAST TECHNIQUE: Multiplanar, multiecho pulse sequences of the brain and surrounding structures were obtained without and with intravenous contrast. Angiographic images of the Circle of Willis were obtained using MRA technique without intravenous contrast. Angiographic images of the neck were obtained using MRA technique without and with intravenous contrast. Carotid stenosis measurements (when applicable) are obtained utilizing NASCET criteria, using the distal internal carotid diameter as the denominator. CONTRAST:  15mL MULTIHANCE GADOBENATE DIMEGLUMINE 529 MG/ML IV SOLN COMPARISON:  None. FINDINGS: MRI HEAD FINDINGS Brain: No diffusion restriction to suggest acute infarct. No abnormal susceptibility hypointensity to indicate intracranial hemorrhage. Few nonspecific foci of T2 FLAIR hyperintensity in subcortical and periventricular white matter with frontal lobe predominance. Mild parenchymal volume loss. No focal mass effect. No  abnormal enhancement. Extra-axial space: No hydrocephalus. No extra-axial collection is identified. Proximal intracranial flow voids are maintained. Other: No abnormal signal of the paranasal sinuses. No abnormal signal of the mastoid air cells. Orbits are unremarkable. Calvarium is unremarkable. MRA HEAD FINDINGS Anterior circulation: Bilateral internal carotid arteries, MCA, and ACA are patent. No occlusion, aneurysm, dissection, or significant stenosis is identified. Posterior circulation: Bilateral vertebral arteries, the basilar artery, and bilateral posterior cerebral arteries are patent. No occlusion, aneurysm, dissection, or significant stenosis is identified. Anatomic variants: Diminutive left posterior communicating artery. No right posterior communicating artery or anterior communicating artery identified, hypoplastic or absent. MRA NECK FINDINGS Aortic arch: 3 vessel arch Right common carotid artery: Patent. Right internal carotid artery: Patent. Right vertebral artery: Patent. Left common carotid artery: Patent. Left Internal carotid artery: Patent. Left Vertebral artery: Patent. No occlusion, aneurysm, dissection, or significant stenosis is identified. IMPRESSION: 1. No acute intracranial abnormality is identified. 2. Mild chronic microvascular ischemic changes and parenchymal volume loss. 3. Patent circle of Willis. No occlusion, aneurysm, dissection, or significant stenosis is identified. 4. Carotid and vertebral arteries of the neck are patent. No occlusion, aneurysm, dissection, or significant stenosis is identified. Electronically Signed   By: Mitzi Hansen M.D.   On: 12/09/2015 18:13    Microbiology: Recent Results (from the past 240 hour(s))  Culture, Urine     Status: None (Preliminary result)   Collection Time: 12/09/15  5:29 AM  Result Value Ref Range Status   Specimen Description URINE, RANDOM  Final   Special Requests ADDED [id number]  Final   Culture CULTURE  REINCUBATED FOR BETTER GROWTH  Final   Report Status PENDING  Incomplete  Culture, blood (routine x  2)     Status: None (Preliminary result)   Collection Time: 12/09/15  9:49 AM  Result Value Ref Range Status   Specimen Description BLOOD RIGHT HAND  Final   Special Requests IN PEDIATRIC BOTTLE 3CC  Final   Culture NO GROWTH 1 DAY  Final   Report Status PENDING  Incomplete  Culture, blood (routine x 2)     Status: None (Preliminary result)   Collection Time: 12/09/15  9:55 AM  Result Value Ref Range Status   Specimen Description BLOOD LEFT HAND  Final   Special Requests IN PEDIATRIC BOTTLE  Final   Culture NO GROWTH 1 DAY  Final   Report Status PENDING  Incomplete  MRSA PCR Screening     Status: None   Collection Time: 12/09/15  1:55 PM  Result Value Ref Range Status   MRSA by PCR NEGATIVE NEGATIVE Final    Comment:        The GeneXpert MRSA Assay (FDA approved for NASAL specimens only), is one component of a comprehensive MRSA colonization surveillance program. It is not intended to diagnose MRSA infection nor to guide or monitor treatment for MRSA infections.      Labs: Basic Metabolic Panel:  Recent Labs Lab 12/09/15 0300 12/09/15 0926 12/10/15 0054 12/10/15 1307  NA 135  --  139 142  K 3.4*  --  3.6 3.9  CL 98*  --  106 108  CO2 19*  --  24 26  GLUCOSE 216*  --  124* 155*  BUN 10  --  13 10  CREATININE 0.71  --  0.64 0.70  CALCIUM 10.4*  --  8.9 8.8*  MG  --  1.4*  --  1.8  PHOS  --  3.5  --   --    Liver Function Tests:  Recent Labs Lab 12/09/15 0300 12/10/15 0054  AST 21 14*  ALT 23 19  ALKPHOS 107 81  BILITOT 0.6 0.4  PROT 8.0 6.7  ALBUMIN 4.9 3.9   No results for input(s): LIPASE, AMYLASE in the last 168 hours. No results for input(s): AMMONIA in the last 168 hours. CBC:  Recent Labs Lab 12/09/15 0300 12/10/15 0054  WBC 11.7* 8.7  NEUTROABS 8.7*  --   HGB 14.8 12.7  HCT 43.8 37.6  MCV 83.9 84.1  PLT 303 227   Cardiac  Enzymes:  Recent Labs Lab 12/09/15 0926 12/09/15 1833 12/10/15 0054  TROPONINI <0.03 <0.03 <0.03   BNP: BNP (last 3 results) No results for input(s): BNP in the last 8760 hours.  ProBNP (last 3 results) No results for input(s): PROBNP in the last 8760 hours.  CBG:  Recent Labs Lab 12/09/15 1139 12/09/15 1811 12/09/15 2041 12/10/15 0747 12/10/15 1115  GLUCAP 98 126* 155* 95 211*       Signed:  Tidus Upchurch MD, PhD  [facility] 12/10/2015, 4:10 PM

## 2015-12-10 NOTE — Evaluation (Signed)
Physical Therapy Evaluation Patient Details Name: Amber Patton MRN: [id number] DOB: 18-Apr-1945 Today's Date: 12/10/2015   History of Present Illness  70 y.o. female with medical history significant of panick attacks, depression, DM, GERD, HTN, Hepatitis, HLD, presenting w/ blurry vision, and tremor and garbled speech. Of note patient's husband and daughter are acting as her interpreter as her English is limited. Patient reports waking up on 12/08/2015 with a sensation of tremor and shakiness in her hands along with intermittent episodes of blurry vision and vision loss. Her vision would last only a matter of seconds. Patient also had a couple of episodes over the course the day of garbled speech per her husband. Patient also endorses headache described as "shocking or pulling of the brain. " Over the course of the day symptoms slowly improved however just before bedtime symptoms significantly worse and she came to the emergency room.   Clinical Impression  Pt presents at mod I or independent level of functioning, no LOB during gait or stair negotiation during eval. Pt with no further PT needs at this time.    Follow Up Recommendations No PT follow up    Equipment Recommendations  None recommended by PT    Recommendations for Other Services       Precautions / Restrictions Restrictions Weight Bearing Restrictions: No      Mobility  Bed Mobility Overal bed mobility: Independent                Transfers Overall transfer level: Modified independent Equipment used: None             General transfer comment: increased time for sit to stand  Ambulation/Gait Ambulation/Gait assistance: Independent Ambulation Distance (Feet): 170 Feet Assistive device: None       General Gait Details: pt with good gait speed, no LOB  Stairs Stairs: Yes Stairs assistance: Supervision Stair Management: No rails Number of Stairs: 1 General stair comments: pt able to step up, turn  around on stair, and descend without physical assistance  Wheelchair Mobility    Modified Rankin (Stroke Patients Only)       Balance                                             Pertinent Vitals/Pain Pain Assessment: No/denies pain    Home Living Family/patient expects to be discharged to:: Private residence Living Arrangements: Spouse/significant other Available Help at Discharge: Family Type of Home: House Home Access: Stairs to enter Entrance Stairs-Rails: Right Entrance Stairs-Number of Steps: 3 Home Layout: One level        Prior Function Level of Independence: Independent               Hand Dominance        Extremity/Trunk Assessment   Upper Extremity Assessment: Overall WFL for tasks assessed           Lower Extremity Assessment: Overall WFL for tasks assessed      Cervical / Trunk Assessment: Normal  Communication   Communication: Prefers language other than English  Cognition Arousal/Alertness: Awake/alert Behavior During Therapy: WFL for tasks assessed/performed Overall Cognitive Status: Within Functional Limits for tasks assessed                      General Comments      Exercises  Assessment/Plan    PT Assessment Patent does not need any further PT services  PT Diagnosis     PT Problem List    PT Treatment Interventions     PT Goals (Current goals can be found in the Care Plan section) Acute Rehab PT Goals PT Goal Formulation: All assessment and education complete, DC therapy    Frequency     Barriers to discharge        Co-evaluation               End of Session   Activity Tolerance: Patient tolerated treatment well Patient left: in chair;with call bell/phone within reach;with family/visitor present Nurse Communication: Mobility status         Time: 1440-1453 PT Time Calculation (min) (ACUTE ONLY): 13 min   Charges:   PT Evaluation $PT Eval Moderate Complexity: 1  Procedure     PT G Codes:        Lennis Korb 12/10/2015, 2:53 PM

## 2015-12-11 LAB — URINE CULTURE: Culture: 10000 — AB

## 2015-12-14 LAB — CULTURE, BLOOD (ROUTINE X 2)
Culture: NO GROWTH
Culture: NO GROWTH

## 2015-12-17 ENCOUNTER — Other Ambulatory Visit: Payer: Self-pay | Admitting: Physician Assistant

## 2015-12-17 DIAGNOSIS — R000 Tachycardia, unspecified: Secondary | ICD-10-CM

## 2015-12-25 ENCOUNTER — Ambulatory Visit (INDEPENDENT_AMBULATORY_CARE_PROVIDER_SITE_OTHER): Payer: Medicare Other

## 2015-12-25 DIAGNOSIS — R000 Tachycardia, unspecified: Secondary | ICD-10-CM

## 2015-12-26 ENCOUNTER — Ambulatory Visit (INDEPENDENT_AMBULATORY_CARE_PROVIDER_SITE_OTHER): Payer: Medicare Other | Admitting: Physician Assistant

## 2015-12-26 ENCOUNTER — Telehealth: Payer: Self-pay | Admitting: Cardiology

## 2015-12-26 ENCOUNTER — Encounter: Payer: Self-pay | Admitting: Physician Assistant

## 2015-12-26 ENCOUNTER — Ambulatory Visit: Payer: Medicare Other | Admitting: Physician Assistant

## 2015-12-26 VITALS — BP 120/84 | HR 88 | Ht <= 58 in | Wt 157.4 lb

## 2015-12-26 DIAGNOSIS — I10 Essential (primary) hypertension: Secondary | ICD-10-CM

## 2015-12-26 DIAGNOSIS — R000 Tachycardia, unspecified: Secondary | ICD-10-CM

## 2015-12-26 DIAGNOSIS — E785 Hyperlipidemia, unspecified: Secondary | ICD-10-CM | POA: Diagnosis not present

## 2015-12-26 NOTE — Telephone Encounter (Signed)
Strips received in office and reviewed with Ronie Spiesayna Dunn, PA. Dayna can see pt today.  I have scheduled this appt for 2 PM today.  I placed call to number listed for pt and left message to call office.  Message also left on number listed for pt's daughter to call office.

## 2015-12-26 NOTE — Patient Instructions (Signed)
Medication Instructions:  Your physician recommends that you continue on your current medications as directed. Please refer to the Current Medication list given to you today.   Labwork: -None  Testing/Procedures: -None  Follow-Up: Your physician recommends that you keep your scheduled  follow-up appointment with Dr. Allyson SabalBerry   Any Other Special Instructions Will Be Listed Below (If Applicable).     If you need a refill on your cardiac medications before your next appointment, please call your pharmacy.

## 2015-12-26 NOTE — Telephone Encounter (Signed)
I spoke with pt. She is unable to be here at 2 PM today.  She is able to come in earlier today.  Appt made for her to see Ronie Spiesayna Dunn, PA at 11:30 today

## 2015-12-26 NOTE — Progress Notes (Signed)
Cardiology Office Note    Date:  12/26/2015  ID:  Amber Patton, DOB April 06, 1946, MRN [id number] PCP:  Lavell Islam, MD  Cardiologist:  Dr. Allyson Sabal saw in hospital  Chief Complaint: abnormal event monitor  History of Present Illness:  Amber Patton is a 70 y.o. female with history of DM, hyperlipidemia, HTN, tremor, depression, anxiety, panic attacks, GERD, obesity who was recently admitted to the hospital with tremors, blurry vision and speech difficulty after developing a panic attack. She was also treated for UTI. Cardiology was consulted for tachycardia with HR in the 120s at times. It was felt difficult to know exactly what her rhythm was, question sinus tach/junctional tachycardia, low P wave amplitude at times. Ultimately it was felt this did not represent atrial fib. She was felt to be dehydrated so lytes were repleted, she was hydrated, and HCTZ was stopped. Workup showed normal thyroid function, EtOH negative, CT/MRI brain were unremarkable. 2D echo 12/10/15: mild LVH, EF 65-70%, no RWMA, mod LAE. IM felt her presenting sx were likely related to anxiety. She had reported drinking energy drinks twice a day but has since discontinued.   She was called by her event monitoring company last night due to elevated HR. She got ready for bed around 11pm without any acute issues. At 11:26pm her event monitor triggered for a tachycardia. At 1:09am she triggered for irregular heartbeat. These were labeled afib by the monitoring company. Due to the autotrigger of afib, she was added onto clinic today to further discuss. She denies any awareness of any heart issues at that time. No CP, SOB, syncope, palpitations. She does have intermittent dizziness but mostly when bending over then standing back up. No recurrent neuro issues.   Past Medical History:  Diagnosis Date  . Anxiety   . Arthritis   . Depression   . DM2 (diabetes mellitus, type 2) (HCC)   . GERD (gastroesophageal reflux disease)     . Hepatitis 1980's  . HTN (hypertension)   . Hypercholesteremia   . Obesity   . Panic attacks   . PONV (postoperative nausea and vomiting)   . Tachycardia     Past Surgical History:  Procedure Laterality Date  . ABDOMINAL HYSTERECTOMY     TAH/BSO  . BREAST BIOPSY    . CHOLECYSTECTOMY    . hysterectomy - unknown type    . WRIST SURGERY      Current Medications: Current Outpatient Prescriptions  Medication Sig Dispense Refill  . amLODipine (NORVASC) 10 MG tablet Take 10 mg by mouth daily.    Marland Kitchen atorvastatin (LIPITOR) 10 MG tablet Take 10 mg by mouth daily.    Marland Kitchen buPROPion (WELLBUTRIN XL) 150 MG 24 hr tablet Take 450 mg by mouth daily.     . butalbital-acetaminophen-caffeine (FIORICET, ESGIC) 50-325-40 MG per tablet Take 1 tablet by mouth 2 (two) times daily as needed for headache.    . glimepiride (AMARYL) 4 MG tablet Take 4 mg by mouth daily before breakfast.    . lisinopril (PRINIVIL,ZESTRIL) 20 MG tablet Take 1 tablet (20 mg total) by mouth daily. 30 tablet 0  . metFORMIN (GLUCOPHAGE-XR) 500 MG 24 hr tablet Take 1,500 mg by mouth daily with breakfast.     . metoCLOPramide (REGLAN) 10 MG tablet Take 5 mg by mouth daily.     . mirtazapine (REMERON) 15 MG tablet Take 15 mg by mouth at bedtime.    . traZODone (DESYREL) 50 MG tablet Take 50 mg by mouth at bedtime.  No current facility-administered medications for this visit.      Allergies:   Aspirin and Vicodin [hydrocodone-acetaminophen]   Social History   Social History  . Marital status: Married    Spouse name: N/A  . Number of children: 4  . Years of education: N/A   Occupational History  . home maker    Social History Main Topics  . Smoking status: Never Smoker  . Smokeless tobacco: Never Used  . Alcohol use No     Comment: rarely  . Drug use: No  . Sexual activity: No   Other Topics Concern  . None   Social History Narrative  . None     Family History:  The patient's family history includes  Aneurysm in her mother; Diabetes in her paternal grandmother; Heart attack in her mother; Hypertension in her paternal grandmother; Stomach cancer in her father; Stroke in her paternal grandmother.   ROS:   Please see the history of present illness.  All other systems are reviewed and otherwise negative.    PHYSICAL EXAM:   VS:  BP 120/84   Pulse 88   Ht 4\' 9"  (1.448 m)   Wt 157 lb 6.4 oz (71.4 kg)   SpO2 95%   BMI 34.06 kg/m   BMI: Body mass index is 34.06 kg/m. GEN: Well nourished, well developed overweight F, in no acute distress  HEENT: normocephalic, atraumatic Neck: no JVD, carotid bruits, or masses Cardiac: RRR; no murmurs, rubs, or gallops, no edema  Respiratory:  clear to auscultation bilaterally, normal work of breathing GI: soft, nontender, nondistended, + BS MS: no deformity or atrophy  Skin: warm and dry, no rash Neuro:  Alert and Oriented x 3, Strength and sensation are intact, follows commands Psych: euthymic mood, full affect  Wt Readings from Last 3 Encounters:  12/26/15 157 lb 6.4 oz (71.4 kg)  12/10/15 154 lb 8 oz (70.1 kg)  11/27/15 156 lb (70.8 kg)      Studies/Labs Reviewed:   EKG:  EKG was ordered today and personally reviewed by me and demonstrates NSR 87bpm, low P wave mplitude, TWI III, otherwise no acute changes, poor R wave progression  Recent Labs: 12/09/2015: TSH 1.900 12/10/2015: ALT 19; BUN 10; Creatinine, Ser 0.70; Hemoglobin 12.7; Magnesium 1.8; Platelets 227; Potassium 3.9; Sodium 142   Lipid Panel    Component Value Date/Time   CHOL 162 12/10/2015 0054   TRIG 219 (H) 12/10/2015 0054   HDL 45 12/10/2015 0054   CHOLHDL 3.6 12/10/2015 0054   VLDL 44 (H) 12/10/2015 0054   LDLCALC 73 12/10/2015 0054   LDLDIRECT 93.8 02/25/2007 1119    Additional studies/ records that were reviewed today include: Summarized above.    ASSESSMENT & PLAN:   1. Tachycardia - event monitor recordings reviewed with Dr. Excell Seltzerooper. Quality of tracing is  suboptimal due to patient movement/artifact but in majority of the beats there is some P wave activity able to be identified. At this time Dr. Excell Seltzerooper does not feel this represents atrial fib and would not recommend anticoagulation at this time. We question sinus tach with possible MAT/WAP based on the strips. Would recommend to continue to monitor to exclude atrial fib as this monitor was just placed yesterday. Will not start BB/CCB yet as I do not want to mask any arrhythmias that may be identified during the monitoring period. Warning sx reviewed. Also reviewed with patient how to use the notification button during episodes of dizziness.  2. HTN - BP controlled  off HCTZ. Continue current regimen. 3. Hyperlipidemia - followed by PCP.  Disposition: F/u with Dr. Allyson Sabal after event monitor completion.   Medication Adjustments/Labs and Tests Ordered: Current medicines are reviewed at length with the patient today.  Concerns regarding medicines are outlined above. Medication changes, Labs and Tests ordered today are summarized above and listed in the Patient Instructions accessible in Encounters.   Thomasene Mohair PA-C  12/26/2015 11:41 AM    [facility] [address] [place], [place], [place]  [address] Phone: [phone]; Fax: [phone]

## 2015-12-26 NOTE — Telephone Encounter (Signed)
[employer] called to state patient had first episode of atrial fibrillation at 120bpm.  [employer] called patient and talked with her husband.  He refused to put the patient on the phone or get a followup tracing.  He was very angry for the call in the middle of the night. Please call patient in am when office opens to get patient in to be seen today 9/14.

## 2015-12-27 ENCOUNTER — Encounter: Payer: Self-pay | Admitting: Cardiovascular Disease

## 2016-01-29 ENCOUNTER — Ambulatory Visit (INDEPENDENT_AMBULATORY_CARE_PROVIDER_SITE_OTHER): Payer: Medicare Other | Admitting: Cardiovascular Disease

## 2016-01-29 ENCOUNTER — Ambulatory Visit: Payer: Medicare Other | Admitting: Cardiovascular Disease

## 2016-01-29 ENCOUNTER — Encounter: Payer: Self-pay | Admitting: Cardiovascular Disease

## 2016-01-29 VITALS — BP 110/70 | HR 96 | Ht <= 58 in | Wt 156.0 lb

## 2016-01-29 DIAGNOSIS — I4891 Unspecified atrial fibrillation: Secondary | ICD-10-CM | POA: Diagnosis not present

## 2016-01-29 DIAGNOSIS — I10 Essential (primary) hypertension: Secondary | ICD-10-CM | POA: Diagnosis not present

## 2016-01-29 NOTE — Patient Instructions (Addendum)
Medication Instructions:  NO CHANGES.  Labwork: NONE  Testing/Procedures: NONE  Follow-Up: You have been referred to A. FIB CLINIC FOR EVALUATION OF CARDIAC EVENT MONITOR RESULTS.  Your physician wants you to follow-up in: 6 MONTHS WITH DR Allyson SabalBERRY.  You will receive a reminder letter in the mail two months in advance. If you don't receive a letter, please call our office to schedule the follow-up appointment.   If you need a refill on your cardiac medications before your next appointment, please call your pharmacy.

## 2016-01-29 NOTE — Progress Notes (Signed)
01/29/2016 Amber Patton   [birth year]  [id number]  Primary Physician Amber Islam, MD Primary Cardiologist: Amber Gess MD Amber Patton  HPI:  Amber Patton is a 70 year old moderately over weight married Amber Patton female mother of 4, grandmother of a grandchildren who is accompanied by her husband Amber Patton today. Her primary care provider is Dr. Gabriel Patton. She was initially hospitalized for what sounds like a UTI with associated blurred vision, difficulty speaking or panic attack. During her hospitalization she had tachycardia with heart rates in the 120s. A 2-D echo was normal. She does have a history of hypertension, hyperlipidemia and diabetes. She does not smoke. There is no family history. She has never had a heart attack or stroke. Recent event monitor showed sinus rhythm with what may be episodes of atrial fibrillation although there was significant baseline artifact.   Current Outpatient Prescriptions  Medication Sig Dispense Refill  . mirtazapine (REMERON) 7.5 MG tablet Take 1 tablet by mouth at bedtime.    Marland Kitchen amLODipine (NORVASC) 10 MG tablet Take 10 mg by mouth daily.    Marland Kitchen atorvastatin (LIPITOR) 10 MG tablet Take 10 mg by mouth daily.    Marland Kitchen buPROPion (WELLBUTRIN XL) 150 MG 24 hr tablet Take 450 mg by mouth daily.     . butalbital-acetaminophen-caffeine (FIORICET, ESGIC) 50-325-40 MG per tablet Take 1 tablet by mouth 2 (two) times daily as needed for headache.    . glimepiride (AMARYL) 4 MG tablet Take 4 mg by mouth daily before breakfast.    . lisinopril (PRINIVIL,ZESTRIL) 20 MG tablet Take 1 tablet (20 mg total) by mouth daily. 30 tablet 0  . metFORMIN (GLUCOPHAGE-XR) 500 MG 24 hr tablet Take 1,500 mg by mouth daily with breakfast.     . metoCLOPramide (REGLAN) 10 MG tablet Take 5 mg by mouth daily.     . traZODone (DESYREL) 50 MG tablet Take 50 mg by mouth at bedtime.     No current facility-administered medications for this visit.     Allergies    Allergen Reactions  . Aspirin Other (See Comments)    Reaction unknown  . Vicodin [Hydrocodone-Acetaminophen] Other (See Comments)    Reaction unknown    Social History   Social History  . Marital status: Married    Spouse name: N/A  . Number of children: 4  . Years of education: N/A   Occupational History  . home maker    Social History Main Topics  . Smoking status: Never Smoker  . Smokeless tobacco: Never Used  . Alcohol use No     Comment: rarely  . Drug use: No  . Sexual activity: No   Other Topics Concern  . Not on file   Social History Narrative  . No narrative on file     Review of Systems: General: negative for chills, fever, night sweats or weight changes.  Cardiovascular: negative for chest pain, dyspnea on exertion, edema, orthopnea, palpitations, paroxysmal nocturnal dyspnea or shortness of breath Dermatological: negative for rash Respiratory: negative for cough or wheezing Urologic: negative for hematuria Abdominal: negative for nausea, vomiting, diarrhea, bright red blood per rectum, melena, or hematemesis Neurologic: negative for visual changes, syncope, or dizziness All other systems reviewed and are otherwise negative except as noted above.    Blood pressure 110/70, pulse 96, height 4\' 9"  (1.448 m), weight 156 lb (70.8 kg).  General appearance: alert and no distress Neck: no adenopathy, no carotid bruit, no JVD, supple, symmetrical, trachea  midline and thyroid not enlarged, symmetric, no tenderness/mass/nodules Lungs: clear to auscultation bilaterally Heart: regular rate and rhythm, S1, S2 normal, no murmur, click, rub or gallop Extremities: extremities normal, atraumatic, no cyanosis or edema  EKG not performed today  ASSESSMENT AND PLAN:   HYPERLIPIDEMIA History of hyperlipidemia on statin therapy with recent lipid profile performed 12/10/15 revealed total cholesterol 152, LDL 73 and HDL of 45  Essential hypertension History of  hypertension blood pressure measured today at 110/70. She is on amlodipine, and lisinopril. Continue current meds at current dosing  Atrial fibrillation with rapid ventricular response [facility](HCC) Mrs. Camelia Patton was noted to be tachycardic during her recent hospitalization for tremors, blurry vision, speech difficulty and panic attack. She was found to have a UTI. A 2-D echo was normal. Monitor apparently showed tachycardia which was reviewed by Dr. Excell Seltzerooper who did not think this was A. fib rather PAT or WAP. On exam her heart rate is regular today. The 30 day event monitor showed sinus rhythm but also may have shown a chill fibrillation. There was a lot of baseline artifact. I'm going to refer her to the atrial fibrillation clinic for further evaluation.      Amber GessJonathan J. Ova Meegan MD FACP,FACC,FAHA, [facility] 01/29/2016 1:47 PM

## 2016-01-29 NOTE — Assessment & Plan Note (Signed)
History of hyperlipidemia on statin therapy with recent lipid profile performed 12/10/15 revealed total cholesterol 152, LDL 73 and HDL of 45

## 2016-01-29 NOTE — Assessment & Plan Note (Signed)
History of hypertension blood pressure measured today at 110/70. She is on amlodipine, and lisinopril. Continue current meds at current dosing

## 2016-01-29 NOTE — Assessment & Plan Note (Signed)
Mrs. Amber Patton was noted to be tachycardic during her recent hospitalization for tremors, blurry vision, speech difficulty and panic attack. She was found to have a UTI. A 2-D echo was normal. Monitor apparently showed tachycardia which was reviewed by Dr. Excell Seltzerooper who did not think this was A. fib rather PAT or WAP. On exam her heart rate is regular today. The 30 day event monitor showed sinus rhythm but also may have shown a chill fibrillation. There was a lot of baseline artifact. I'm going to refer her to the atrial fibrillation clinic for further evaluation.

## 2016-02-06 ENCOUNTER — Ambulatory Visit (HOSPITAL_COMMUNITY)
Admission: RE | Admit: 2016-02-06 | Discharge: 2016-02-06 | Disposition: A | Discharge status: Home / Self Care | Payer: Medicare Other | Source: Ambulatory Visit | Attending: Nurse Practitioner | Admitting: Nurse Practitioner

## 2016-02-06 ENCOUNTER — Encounter (HOSPITAL_COMMUNITY): Payer: Self-pay | Admitting: Nurse Practitioner

## 2016-02-06 VITALS — BP 144/92 | HR 104 | Ht <= 58 in | Wt 157.6 lb

## 2016-02-06 DIAGNOSIS — K759 Inflammatory liver disease, unspecified: Secondary | ICD-10-CM | POA: Diagnosis not present

## 2016-02-06 DIAGNOSIS — I480 Paroxysmal atrial fibrillation: Secondary | ICD-10-CM | POA: Diagnosis not present

## 2016-02-06 DIAGNOSIS — E119 Type 2 diabetes mellitus without complications: Secondary | ICD-10-CM | POA: Diagnosis not present

## 2016-02-06 DIAGNOSIS — I10 Essential (primary) hypertension: Secondary | ICD-10-CM | POA: Insufficient documentation

## 2016-02-06 DIAGNOSIS — F410 Panic disorder [episodic paroxysmal anxiety] without agoraphobia: Secondary | ICD-10-CM | POA: Diagnosis not present

## 2016-02-06 DIAGNOSIS — R000 Tachycardia, unspecified: Secondary | ICD-10-CM | POA: Diagnosis not present

## 2016-02-06 DIAGNOSIS — I4892 Unspecified atrial flutter: Secondary | ICD-10-CM | POA: Diagnosis not present

## 2016-02-06 DIAGNOSIS — Z7984 Long term (current) use of oral hypoglycemic drugs: Secondary | ICD-10-CM | POA: Insufficient documentation

## 2016-02-06 DIAGNOSIS — Z79899 Other long term (current) drug therapy: Secondary | ICD-10-CM | POA: Insufficient documentation

## 2016-02-06 DIAGNOSIS — I4891 Unspecified atrial fibrillation: Secondary | ICD-10-CM | POA: Diagnosis present

## 2016-02-07 NOTE — Progress Notes (Addendum)
Primary Care Physician: Lavell IslamLOWARD,DAVIS L, MD Referring Physician: Dr. Dickey GaveBerry   Amber Patton is a 70 y.o. female with a h/o DM, HTN, hepatitis, Panic attacks that was hospitalized in August for extreme shakiness blurry vision and was found to have tachycardia, dehydration, mildly low mag/K+. She was initially treated with cardizem but tachycardia was less likely thought to represent afib. A  30 day monitor was placed on d/c and she had f/u with Dr. Allyson SabalBerry. It  showed some questionable strips and was sent here by Dr. Allyson SabalBerry for further opinion if there was truly afib on monitor. She has not noticed any irregular heart beat since hospitalization. 30 day monitor is difficult to read due to some artifact, but reviewed with Dr. Johney FrameAllred and he believes it is SR with some mixed afib/flutter non sustained and it is not unreasonable to anticoagulant with a chadsvasc score of 4(female,age,htn, DM)  Today, she denies symptoms of palpitations, chest pain, shortness of breath, orthopnea, PND, lower extremity edema, dizziness, presyncope, syncope, or neurologic sequela. The patient is tolerating medications without difficulties and is otherwise without complaint today.   Past Medical History:  Diagnosis Date  . Anxiety   . Arthritis   . Depression   . DM2 (diabetes mellitus, type 2) (HCC)   . GERD (gastroesophageal reflux disease)   . Hepatitis 1980's  . HTN (hypertension)   . Hypercholesteremia   . Obesity   . Panic attacks   . PONV (postoperative nausea and vomiting)   . Tachycardia    a. 11/2015 - sinus tach/junctional tach.   Past Surgical History:  Procedure Laterality Date  . ABDOMINAL HYSTERECTOMY     TAH/BSO  . BREAST BIOPSY    . CHOLECYSTECTOMY    . hysterectomy - unknown type    . WRIST SURGERY      Current Outpatient Prescriptions  Medication Sig Dispense Refill  . amLODipine (NORVASC) 10 MG tablet Take 10 mg by mouth daily.    Marland Kitchen. atorvastatin (LIPITOR) 10 MG tablet Take 10 mg  by mouth daily.    Marland Kitchen. buPROPion (WELLBUTRIN XL) 150 MG 24 hr tablet Take 450 mg by mouth daily.     . butalbital-acetaminophen-caffeine (FIORICET, ESGIC) 50-325-40 MG per tablet Take 1 tablet by mouth 2 (two) times daily as needed for headache.    . glimepiride (AMARYL) 4 MG tablet Take 4 mg by mouth daily before breakfast.    . lisinopril (PRINIVIL,ZESTRIL) 20 MG tablet Take 1 tablet (20 mg total) by mouth daily. 30 tablet 0  . metFORMIN (GLUCOPHAGE-XR) 500 MG 24 hr tablet Take 1,500 mg by mouth daily with breakfast.     . metoCLOPramide (REGLAN) 10 MG tablet Take 5 mg by mouth daily.     . mirtazapine (REMERON) 7.5 MG tablet Take 1 tablet by mouth at bedtime.    . traZODone (DESYREL) 50 MG tablet Take 50 mg by mouth at bedtime.     No current facility-administered medications for this encounter.     Allergies  Allergen Reactions  . Aspirin Other (See Comments)    Reaction unknown  . Vicodin [Hydrocodone-Acetaminophen] Other (See Comments)    Reaction unknown    Social History   Social History  . Marital status: Married    Spouse name: N/A  . Number of children: 4  . Years of education: N/A   Occupational History  . home maker    Social History Main Topics  . Smoking status: Never Smoker  . Smokeless tobacco:  Never Used  . Alcohol use No     Comment: rarely  . Drug use: No  . Sexual activity: No   Other Topics Concern  . Not on file   Social History Narrative  . No narrative on file    Family History  Problem Relation Age of Onset  . Cancer    . Stroke    . Cirrhosis    . Diabetes    . Hyperlipidemia    . Stomach cancer Father   . Diabetes Paternal Grandmother   . Hypertension Paternal Grandmother   . Stroke Paternal Grandmother   . Heart attack Mother   . Aneurysm Mother     ROS- All systems are reviewed and negative except as per the HPI above  Physical Exam: Vitals:   02/06/16 1122  BP: (!) 144/92  Pulse: (!) 104  Weight: 157 lb 9.6 oz (71.5 kg)   Height: 4\' 9"  (1.448 m)    GEN- The patient is well appearing, alert and oriented x 3 today.   Head- normocephalic, atraumatic Eyes-  Sclera clear, conjunctiva pink Ears- hearing intact Oropharynx- clear Neck- supple, no JVP Lymph- no cervical lymphadenopathy Lungs- Clear to ausculation bilaterally, normal work of breathing Heart- Regular rate and rhythm, no murmurs, rubs or gallops, PMI not laterally displaced GI- soft, NT, ND, + BS Extremities- no clubbing, cyanosis, or edema MS- no significant deformity or atrophy Skin- no rash or lesion Psych- euthymic mood, full affect Neuro- strength and sensation are intact  EKG- sinus tach at 104 bpm, LAD, Pr int 196 ms, qrs int 76 ms, qtc 402 ms Epic records reviewed  Assessment and Plan: 1.Paroxysmal Afib/flutter asymptomatic  Reviewed 30 day monitor with Dr. Johney Frame and he feels that monitor does represent brief episodes of afib/flutter and with CHA2DS2VASc score of 4, it is reasonable to anticoagulate pt. I explained to pt and wife and want some time to think about it  Will bring pt back Monday 10/30 for f/u and further discussion of anticoagulants.  [name] [facility] [address] [place], [place] [address] [phone]  Addendum- Pt and husband back in office to further discus anticoagulants.Chadsvasc score of 4(age,sex, htn, DM). Options available discussed including warfarin, eliquis and xarelto.Risks vrs benefit of blood thinners discussed. She has no bleeding history and bleeding precautions discussed. They want to check with insurance company to see which drug is most affordable and will get back to me re how they want to proceed.

## 2016-02-10 ENCOUNTER — Ambulatory Visit (HOSPITAL_COMMUNITY)
Admission: RE | Admit: 2016-02-10 | Discharge: 2016-02-10 | Disposition: A | Discharge status: Home / Self Care | Payer: Medicare Other | Source: Ambulatory Visit | Attending: Nurse Practitioner | Admitting: Nurse Practitioner

## 2016-02-27 ENCOUNTER — Telehealth: Payer: Self-pay | Admitting: Cardiovascular Disease

## 2016-02-27 NOTE — Telephone Encounter (Signed)
New message ° ° °Patient returning call back to nurse.  °

## 2016-02-27 NOTE — Telephone Encounter (Signed)
Returned call to pt. LM to call for results.

## 2016-07-16 ENCOUNTER — Emergency Department (HOSPITAL_COMMUNITY)
Admission: EM | Admit: 2016-07-16 | Discharge: 2016-07-16 | Disposition: A | Discharge status: Home / Self Care | Payer: Medicare Other | Attending: Emergency Medicine | Admitting: Emergency Medicine

## 2016-07-16 ENCOUNTER — Encounter (HOSPITAL_COMMUNITY): Payer: Self-pay | Admitting: *Deleted

## 2016-07-16 DIAGNOSIS — K5641 Fecal impaction: Secondary | ICD-10-CM | POA: Diagnosis not present

## 2016-07-16 DIAGNOSIS — I10 Essential (primary) hypertension: Secondary | ICD-10-CM | POA: Insufficient documentation

## 2016-07-16 DIAGNOSIS — Z7984 Long term (current) use of oral hypoglycemic drugs: Secondary | ICD-10-CM | POA: Insufficient documentation

## 2016-07-16 DIAGNOSIS — Z79899 Other long term (current) drug therapy: Secondary | ICD-10-CM | POA: Insufficient documentation

## 2016-07-16 DIAGNOSIS — K6289 Other specified diseases of anus and rectum: Secondary | ICD-10-CM | POA: Diagnosis present

## 2016-07-16 DIAGNOSIS — E119 Type 2 diabetes mellitus without complications: Secondary | ICD-10-CM | POA: Diagnosis not present

## 2016-07-16 LAB — COMPREHENSIVE METABOLIC PANEL
ALT: 13 U/L — ABNORMAL LOW (ref 14–54)
AST: 22 U/L (ref 15–41)
Albumin: 4.6 g/dL (ref 3.5–5.0)
Alkaline Phosphatase: 88 U/L (ref 38–126)
Anion gap: 14 (ref 5–15)
BILIRUBIN TOTAL: 0.5 mg/dL (ref 0.3–1.2)
BUN: 8 mg/dL (ref 6–20)
CHLORIDE: 102 mmol/L (ref 101–111)
CO2: 19 mmol/L — ABNORMAL LOW (ref 22–32)
CREATININE: 0.74 mg/dL (ref 0.44–1.00)
Calcium: 9.8 mg/dL (ref 8.9–10.3)
Glucose, Bld: 199 mg/dL — ABNORMAL HIGH (ref 65–99)
POTASSIUM: 4.3 mmol/L (ref 3.5–5.1)
Sodium: 135 mmol/L (ref 135–145)
TOTAL PROTEIN: 7.1 g/dL (ref 6.5–8.1)

## 2016-07-16 LAB — CBC
HCT: 39.7 % (ref 36.0–46.0)
Hemoglobin: 13.3 g/dL (ref 12.0–15.0)
MCH: 28.2 pg (ref 26.0–34.0)
MCHC: 33.5 g/dL (ref 30.0–36.0)
MCV: 84.3 fL (ref 78.0–100.0)
PLATELETS: 246 10*3/uL (ref 150–400)
RBC: 4.71 MIL/uL (ref 3.87–5.11)
RDW: 13 % (ref 11.5–15.5)
WBC: 9.9 10*3/uL (ref 4.0–10.5)

## 2016-07-16 LAB — LIPASE, BLOOD: LIPASE: 22 U/L (ref 11–51)

## 2016-07-16 MED ORDER — POLYETHYLENE GLYCOL 3350 17 G PO PACK: 17.0000 g | PACK | Freq: Every day | ORAL | 0 refills | Status: AC

## 2016-07-16 MED ORDER — MINERAL OIL RE ENEM
1.0000 | ENEMA | Freq: Once | RECTAL | Status: DC
  Filled 2016-07-16: qty 1

## 2016-07-16 NOTE — ED Triage Notes (Signed)
PT states 6 days no bowel movement and having severe lower abdominal and rectal pain.  Have tried enema and said so hard it will not go in.  Pt states not passing gas.  Reports nausea

## 2016-07-16 NOTE — ED Provider Notes (Signed)
[facility]-EMERGENCY DEPT Provider Note   CSN: [id number] Arrival date & time: 07/16/16  1439     History   Chief Complaint Chief Complaint  Patient presents with  . Abdominal Pain  . Rectal Pain  . Nausea    HPI Amber Patton is a 71 y.o. female.  The history is provided by the patient and the spouse.  Constipation   This is a chronic problem. Episode onset: several years. This episode as of 6 days. The stool is described as firm. Associated symptoms include abdominal pain. Pertinent negatives include no dysuria. She does not exercise regularly. There has been adequate water intake. Treatments tried: enema. The treatment provided no relief. Her past medical history is significant for metabolic disease.    Past Medical History:  Diagnosis Date  . Anxiety   . Arthritis   . Depression   . DM2 (diabetes mellitus, type 2) (HCC)   . GERD (gastroesophageal reflux disease)   . Hepatitis 1980's  . HTN (hypertension)   . Hypercholesteremia   . Obesity   . Panic attacks   . PONV (postoperative nausea and vomiting)   . Tachycardia    a. 11/2015 - sinus tach/junctional tach.    Patient Active Problem List   Diagnosis Date Noted  . Confusion 12/09/2015  . Atrial fibrillation with rapid ventricular response (HCC) 12/09/2015  . Tremor 12/09/2015  . Diabetes mellitus with complication (HCC) 12/09/2015  . HLD (hyperlipidemia) 12/09/2015  . UTI (urinary tract infection) 12/09/2015  . Blurry vision 12/09/2015  . Depression with anxiety 12/09/2015  . Insomnia 12/09/2015  . Osteopenia 11/27/2015  . DM 02/28/2007  . HYPERGLYCEMIA 02/11/2007  . LIVER FUNCTION TESTS, ABNORMAL 01/25/2007  . HYPERLIPIDEMIA 01/21/2007  . DEPRESSION 01/21/2007  . Essential hypertension 01/21/2007  . GERD 01/21/2007  . DIZZINESS 01/21/2007  . DYSPNEA/SHORTNESS OF BREATH 01/21/2007    Past Surgical History:  Procedure Laterality Date  . ABDOMINAL HYSTERECTOMY     TAH/BSO  . BREAST BIOPSY    .  CHOLECYSTECTOMY    . hysterectomy - unknown type    . WRIST SURGERY      OB History    Gravida Para Term Preterm AB Living   SAB TAB Ectopic Multiple Live Births                   Home Medications    Prior to Admission medications   Medication Sig Start Date End Date Taking? Authorizing Provider  amLODipine (NORVASC) 10 MG tablet Take 10 mg by mouth daily.   Yes Historical Provider, MD  atorvastatin (LIPITOR) 10 MG tablet Take 10 mg by mouth daily.   Yes Historical Provider, MD  buPROPion (WELLBUTRIN XL) 150 MG 24 hr tablet Take 450 mg by mouth daily.    Yes Historical Provider, MD  butalbital-acetaminophen-caffeine (FIORICET, ESGIC) 50-325-40 MG per tablet Take 1 tablet by mouth 2 (two) times daily as needed for headache.   Yes Historical Provider, MD  citalopram (CELEXA) 10 MG tablet Take 10 mg by mouth daily.   Yes Historical Provider, MD  glimepiride (AMARYL) 4 MG tablet Take 4 mg by mouth daily before breakfast.   Yes Historical Provider, MD  lisinopril (PRINIVIL,ZESTRIL) 20 MG tablet Take 1 tablet (20 mg total) by mouth daily. 12/10/15  Yes Albertine Grates, MD  metFORMIN (GLUCOPHAGE-XR) 500 MG 24 hr tablet Take 1,500 mg by mouth daily with breakfast.    Yes Historical Provider, MD  metoCLOPramide (REGLAN) 10 MG tablet Take 5 mg by mouth daily.  08/06/15  Yes Historical Provider, MD  mirtazapine (REMERON) 7.5 MG tablet Take 1 tablet by mouth at bedtime. 01/15/16  Yes Historical Provider, MD  QUEtiapine (SEROQUEL) 25 MG tablet Take 25 mg by mouth at bedtime.   Yes Historical Provider, MD  traZODone (DESYREL) 50 MG tablet Take 50 mg by mouth at bedtime.   Yes Historical Provider, MD  polyethylene glycol (MIRALAX / GLYCOLAX) packet Take 17 g by mouth daily. Take 1 capsule (17g) twice a day for 7 days followed by 1 capful once a day until you follow up with your primary care doctor. 07/16/16 08/06/16  Nira Conn, MD    Family History Family History  Problem Relation Age  of Onset  . Cancer    . Stroke    . Cirrhosis    . Diabetes    . Hyperlipidemia    . Stomach cancer Father   . Diabetes Paternal Grandmother   . Hypertension Paternal Grandmother   . Stroke Paternal Grandmother   . Heart attack Mother   . Aneurysm Mother     Social History Social History  Substance Use Topics  . Smoking status: Never Smoker  . Smokeless tobacco: Never Used  . Alcohol use No     Comment: rarely     Allergies   Aspirin and Vicodin [hydrocodone-acetaminophen]   Review of Systems Review of Systems  Constitutional: Negative for chills and fever.  HENT: Negative for ear pain and sore throat.   Eyes: Negative for pain and visual disturbance.  Respiratory: Negative for cough and shortness of breath.   Cardiovascular: Negative for chest pain and palpitations.  Gastrointestinal: Positive for abdominal pain, constipation, nausea and rectal pain. Negative for vomiting.  Genitourinary: Negative for dysuria and hematuria.  Musculoskeletal: Negative for arthralgias and back pain.  Skin: Negative for color change and rash.  Neurological: Negative for seizures and syncope.  All other systems reviewed and are negative.    Physical Exam Updated Vital Signs BP 107/75 (BP Location: Left Arm)   Pulse (!) 102   Temp 98 F (36.7 C) (Oral)   Resp 18   SpO2 97%   Physical Exam  Constitutional: She is oriented to person, place, and time. She appears well-developed and well-nourished. No distress.  In obvious discomfort  HENT:  Head: Normocephalic and atraumatic.  Nose: Nose normal.  Eyes: Conjunctivae and EOM are normal. Pupils are equal, round, and reactive to light. Right eye exhibits no discharge. Left eye exhibits no discharge. No scleral icterus.  Neck: Normal range of motion. Neck supple.  Cardiovascular: Normal rate and regular rhythm.  Exam reveals no gallop and no friction rub.   No murmur heard. Pulmonary/Chest: Effort normal and breath sounds normal. No  stridor. No respiratory distress. She has no rales.  Abdominal: Soft. She exhibits no distension. There is no tenderness.  Genitourinary: Rectal exam shows external hemorrhoid (no thrombosis noted). Rectal exam shows no fissure.  Genitourinary Comments: Chaperone present during pelvic exam. Fecal impaction noted.  Musculoskeletal: She exhibits no edema or tenderness.  Neurological: She is alert and oriented to person, place, and time.  Skin: Skin is warm and dry. No rash noted. She is not diaphoretic. No erythema.  Psychiatric: She has a normal mood and affect.  Vitals reviewed.    ED Treatments / Results  Labs (all labs ordered are listed, but only abnormal results are displayed) Labs Reviewed  COMPREHENSIVE METABOLIC PANEL -  Abnormal; Notable for the following:       Result Value   CO2 19 (*)    Glucose, Bld 199 (*)    ALT 13 (*)    All other components within normal limits  LIPASE, BLOOD  CBC  URINALYSIS, ROUTINE W REFLEX MICROSCOPIC    EKG  EKG Interpretation None       Radiology No results found.  Procedures Fecal disimpaction Date/Time: 07/16/2016 4:12 PM Performed by: Nira Conn Authorized by: Nira Conn  Consent: Verbal consent obtained. Consent given by: patient Patient understanding: patient states understanding of the procedure being performed Patient identity confirmed: arm band Time out: Immediately prior to procedure a "time out" was called to verify the correct patient, procedure, equipment, support staff and site/side marked as required. Local anesthesia used: no  Anesthesia: Local anesthesia used: no  Sedation: Patient sedated: no Patient tolerance: Patient tolerated the procedure well with no immediate complications Comments: Stool is firm, clay-like in consistency, brown, nonbloody.     (including critical care time)  Medications Ordered in ED Medications - No data to display   Initial Impression / Assessment  and Plan / ED Course  I have reviewed the triage vital signs and the nursing notes.  Pertinent labs & imaging results that were available during my care of the patient were reviewed by me and considered in my medical decision making (see chart for details).     Presentation consistent with fecal impaction. Patient does have external hemorrhoids that are nonthrombosed. Abdomen benign. Labs grossly reassuring. No need for advanced imaging at this time. Digital fecal disimpaction performed at bedside. Following this patient was able to have a bowel movement on her own.  Recommended MiraLAX for outpatient management with close PCP follow-up. Safe for discharge with strict return precautions.  Final Clinical Impressions(s) / ED Diagnoses   Final diagnoses:  Fecal impaction (HCC)   Disposition: Discharge  Condition: Good  I have discussed the results, Dx and Tx plan with the patient who expressed understanding and agree(s) with the plan. Discharge instructions discussed at great length. The patient was given strict return precautions who verbalized understanding of the instructions. No further questions at time of discharge.    New Prescriptions   POLYETHYLENE GLYCOL (MIRALAX / GLYCOLAX) PACKET    Take 17 g by mouth daily. Take 1 capsule (17g) twice a day for 7 days followed by 1 capful once a day until you follow up with your primary care doctor.    Follow Up: Lavell Islam, MD [address] [place] [address] [phone]  In 1 week For close follow up to assess for constipation      Nira Conn, MD 07/16/16 [phone]

## 2016-08-26 ENCOUNTER — Encounter: Payer: Self-pay | Admitting: Gynecology

## 2016-10-15 IMAGING — MR MR MRA NECK WO/W CM
10 of 15 series · 27 of 48 positions shown · IV contrast (Yes   MULTIHANCE)
Comparison: None.

CLINICAL DATA: 70 y/o F; presenting with blurry vision, headaches,
tremor, and garbled speech. Past medical history of panic attacks,
depression, diabetes, GERD, hypertension, hyperlipidemia, and
hepatitis.

EXAM:
MRI HEAD WITHOUT AND WITH CONTRAST
MRA HEAD WITHOUT CONTRAST
MRA NECK WITHOUT AND WITH CONTRAST
TECHNIQUE: Multiplanar, multiecho pulse sequences of the brain and surrounding
structures were obtained without and with intravenous contrast.
Angiographic images of the Circle of Willis were obtained using MRA
technique without intravenous contrast. Angiographic images of the
neck were obtained using MRA technique without and with intravenous
contrast. Carotid stenosis measurements (when applicable) are
obtained utilizing NASCET criteria, using the distal internal
carotid diameter as the denominator.
CONTRAST:  15mL MULTIHANCE GADOBENATE DIMEGLUMINE 529 MG/ML IV SOLN

[Series 3: DWI · axial · 3.0mm · 1.09mm/px · z∈[-85,+47]mm · 6 of 90 slices shown (1 of 4)]
[im 1/90]
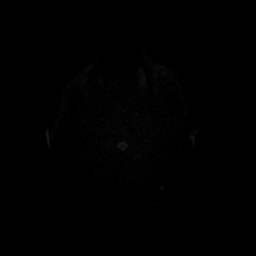
[im 18/90]
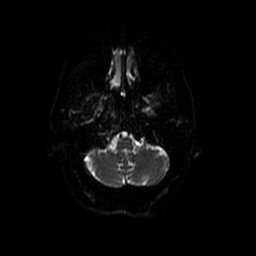
[im 36/90]
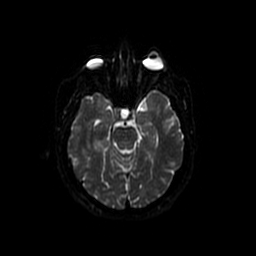
[im 54/90]
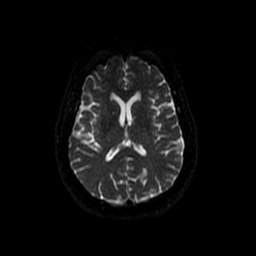
[im 72/90]
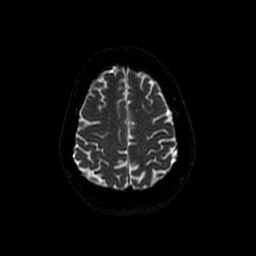
[im 90/90]
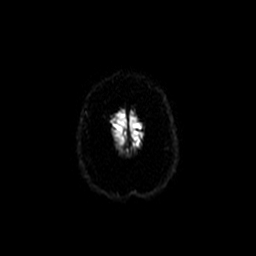

[Series 4: (id) mt fs · axial · 1.4mm · 0.43mm/px · z∈[-93,+13]mm · 8 of 152 slices shown]
[im 1/152]
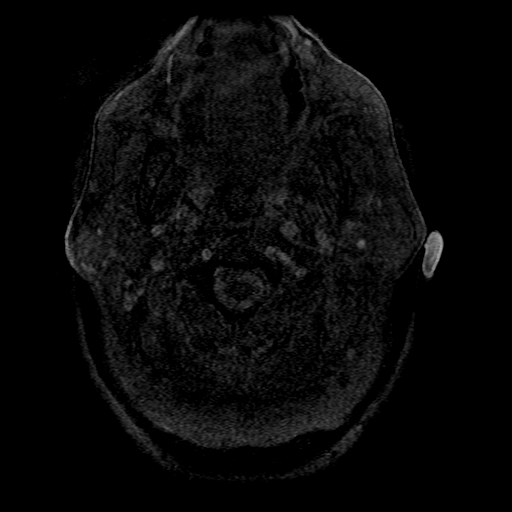
[im 19/152]
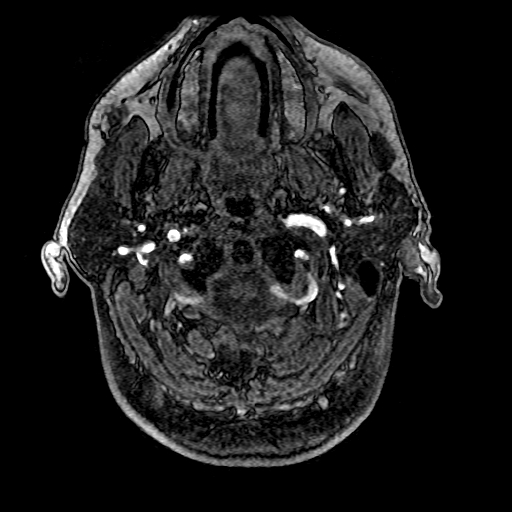
[im 38/152]
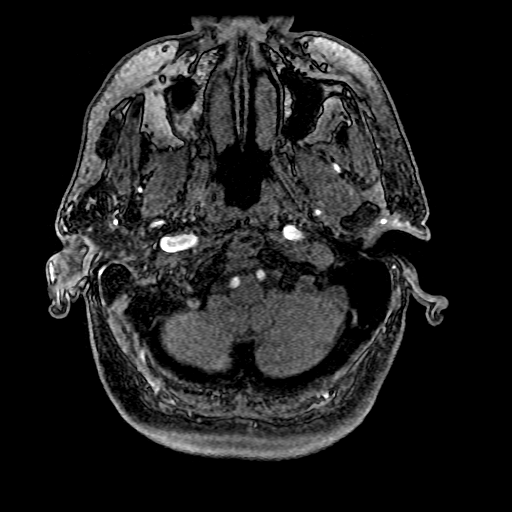
[im 57/152]
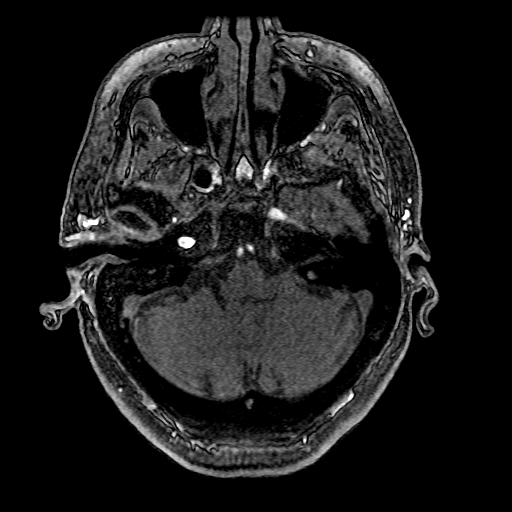
[im 95/152]
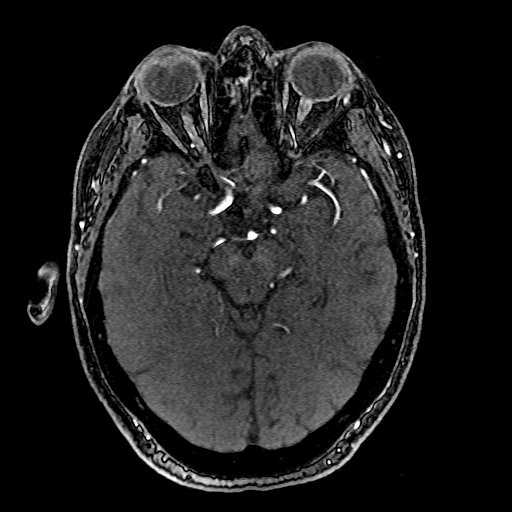
[im 114/152]
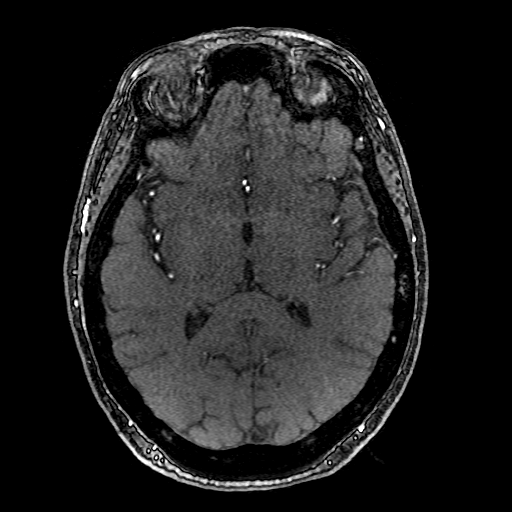
[im 133/152]
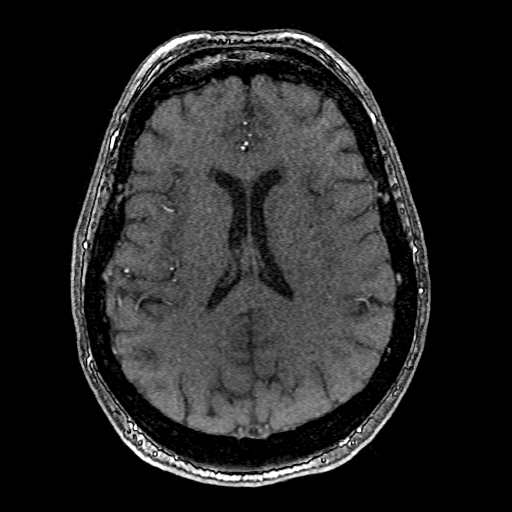
[im 152/152]
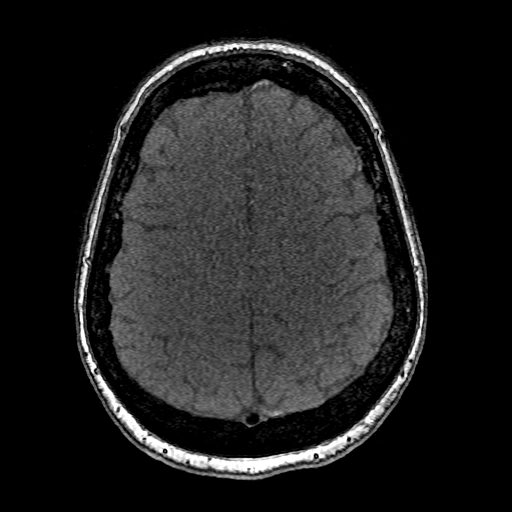

[Series 5: T1 · sagittal · 5.0mm · 0.47mm/px · 1 of 23 slices shown]
[im 1/23]
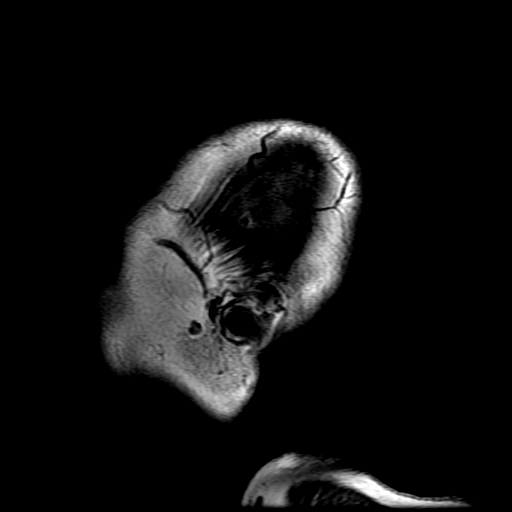

[Series 6: T2 · axial · 5.0mm · 0.43mm/px · 1 of 23 slices shown (1 of 2)]
[im 1/23]
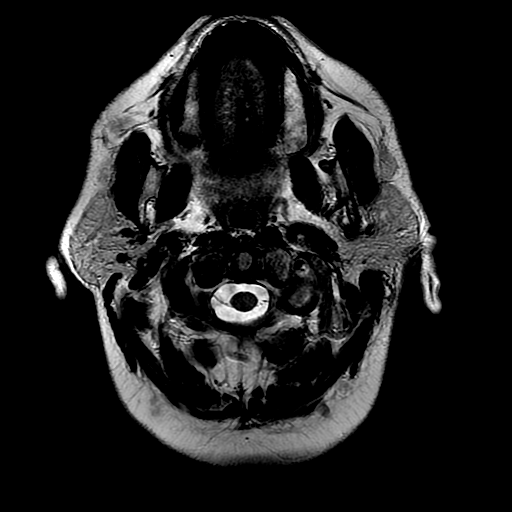

[Series 7: DWI · coronal · 5.0mm · 1.09mm/px · 3 of 62 slices shown (2 of 4)]
[im 1/62]
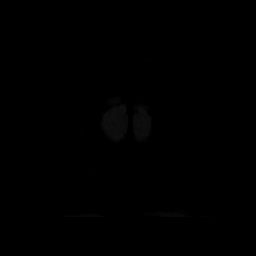
[im 31/62]
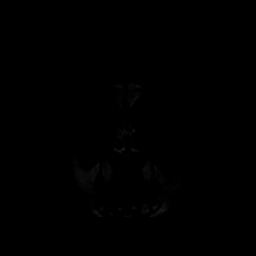
[im 62/62]
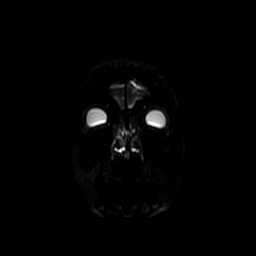

[Series 8: FLAIR · axial · 5.0mm · 0.43mm/px · 1 of 23 slices shown]
[im 1/23]
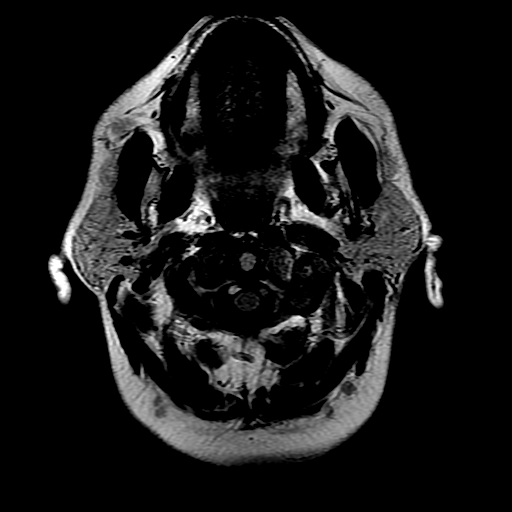

[Series 11: T2 · coronal · 5.0mm · 0.39mm/px · 1 of 24 slices shown (2 of 2)]
[im 1/24]
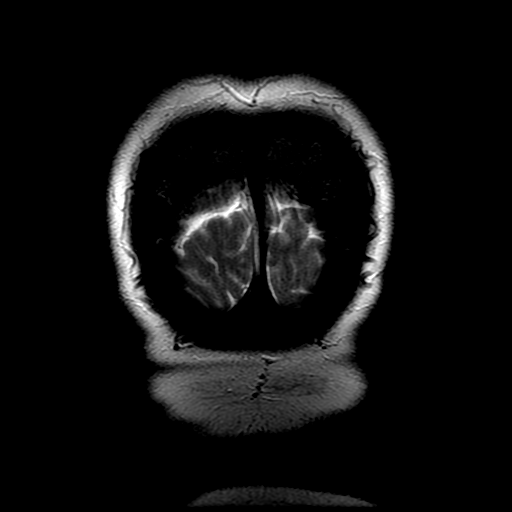

[Series 17: T1 post-contrast · coronal · 5.0mm · 0.39mm/px · 1 of 24 slices shown]
[im 1/24]
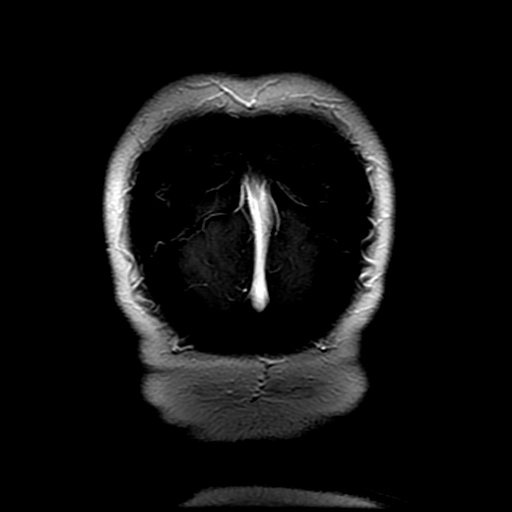

[Series 300: DWI · axial · 3.0mm · 1.09mm/px · z∈[-85,+47]mm · 3 of 45 slices shown (3 of 4)]
[im 1/45]
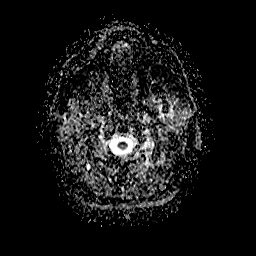
[im 23/45]
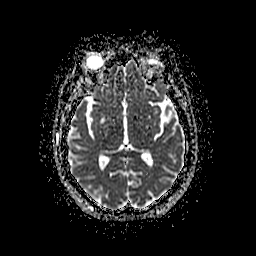
[im 45/45]
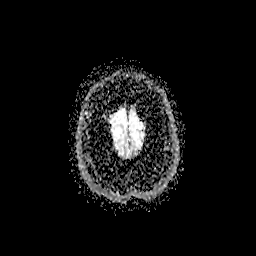

[Series 700: DWI · coronal · 5.0mm · 1.09mm/px · 2 of 31 slices shown (4 of 4)]
[im 1/31]
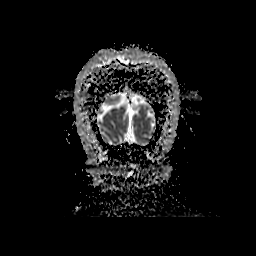
[im 31/31]
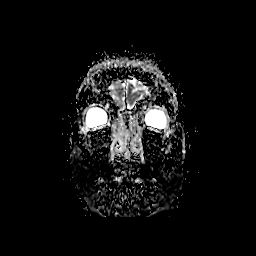

[27 of 48 positions shown; findings below may reference images not displayed]

FINDINGS: MRI HEAD FINDINGS

Brain: No diffusion restriction to suggest acute infarct. No
abnormal susceptibility hypointensity to indicate intracranial
hemorrhage. Few nonspecific foci of T2 FLAIR hyperintensity in
subcortical and periventricular white matter with frontal lobe
predominance. Mild parenchymal volume loss. No focal mass effect. No
abnormal enhancement.

Extra-axial space: No hydrocephalus. No extra-axial collection is
identified. Proximal intracranial flow voids are maintained.

Other: No abnormal signal of the paranasal sinuses. No abnormal
signal of the mastoid air cells. Orbits are unremarkable. Calvarium
is unremarkable.

MRA HEAD FINDINGS

Anterior circulation: Bilateral internal carotid arteries, MCA, and
ACA are patent. No occlusion, aneurysm, dissection, or significant
stenosis is identified.

Posterior circulation: Bilateral vertebral arteries, the basilar
artery, and bilateral posterior cerebral arteries are patent. No
occlusion, aneurysm, dissection, or significant stenosis is
identified.

Anatomic variants: Diminutive left posterior communicating artery.
No right posterior communicating artery or anterior communicating
artery identified, hypoplastic or absent.

MRA NECK FINDINGS

Aortic arch: 3 vessel arch

Right common carotid artery: Patent.

Right internal carotid artery: Patent.

Right vertebral artery: Patent.

Left common carotid artery: Patent.

Left Internal carotid artery: Patent.

Left Vertebral artery: Patent.

No occlusion, aneurysm, dissection, or significant stenosis is
identified.
IMPRESSION: 1. No acute intracranial abnormality is identified.
2. Mild chronic microvascular ischemic changes and parenchymal
volume loss.
3. Patent circle of Willis. No occlusion, aneurysm, dissection, or
significant stenosis is identified.
4. Carotid and vertebral arteries of the neck are patent. No
occlusion, aneurysm, dissection, or significant stenosis is
identified.

By: Keba Livings Koziba M.D.

## 2021-05-02 ENCOUNTER — Other Ambulatory Visit: Payer: Self-pay

## 2021-05-02 ENCOUNTER — Encounter (HOSPITAL_COMMUNITY): Payer: Self-pay | Admitting: Emergency Medicine

## 2021-05-02 ENCOUNTER — Emergency Department (HOSPITAL_COMMUNITY)
Admission: EM | Admit: 2021-05-02 | Discharge: 2021-05-02 | Disposition: A | Discharge status: Home / Self Care | Payer: Medicare Other | Attending: Emergency Medicine | Admitting: Emergency Medicine

## 2021-05-02 DIAGNOSIS — R112 Nausea with vomiting, unspecified: Secondary | ICD-10-CM | POA: Insufficient documentation

## 2021-05-02 DIAGNOSIS — R1013 Epigastric pain: Secondary | ICD-10-CM | POA: Diagnosis not present

## 2021-05-02 DIAGNOSIS — E119 Type 2 diabetes mellitus without complications: Secondary | ICD-10-CM | POA: Insufficient documentation

## 2021-05-02 DIAGNOSIS — Z7984 Long term (current) use of oral hypoglycemic drugs: Secondary | ICD-10-CM | POA: Insufficient documentation

## 2021-05-02 DIAGNOSIS — Z79899 Other long term (current) drug therapy: Secondary | ICD-10-CM | POA: Diagnosis not present

## 2021-05-02 DIAGNOSIS — K259 Gastric ulcer, unspecified as acute or chronic, without hemorrhage or perforation: Secondary | ICD-10-CM

## 2021-05-02 HISTORY — DX: Gastric ulcer, unspecified as acute or chronic, without hemorrhage or perforation: K25.9

## 2021-05-02 LAB — COMPREHENSIVE METABOLIC PANEL
ALT: 12 U/L (ref 0–44)
AST: 15 U/L (ref 15–41)
Albumin: 4.3 g/dL (ref 3.5–5.0)
Alkaline Phosphatase: 92 U/L (ref 38–126)
Anion gap: 11 (ref 5–15)
BUN: 8 mg/dL (ref 8–23)
CO2: 27 mmol/L (ref 22–32)
Calcium: 9.5 mg/dL (ref 8.9–10.3)
Chloride: 103 mmol/L (ref 98–111)
Creatinine, Ser: 0.77 mg/dL (ref 0.44–1.00)
GFR, Estimated: >60 mL/min (ref >60)
Glucose, Bld: 108 mg/dL — ABNORMAL HIGH (ref 70–99)
Potassium: 3.7 mmol/L (ref 3.5–5.1)
Sodium: 141 mmol/L (ref 135–145)
Total Bilirubin: 0.4 mg/dL (ref 0.3–1.2)
Total Protein: 7.2 g/dL (ref 6.5–8.1)

## 2021-05-02 LAB — URINALYSIS, ROUTINE W REFLEX MICROSCOPIC
Bilirubin Urine: NEGATIVE
Glucose, UA: NEGATIVE mg/dL
Hgb urine dipstick: NEGATIVE
Ketones, ur: NEGATIVE mg/dL
Nitrite: NEGATIVE
Protein, ur: NEGATIVE mg/dL
Specific Gravity, Urine: 1.008 (ref 1.005–1.030)
pH: 7 (ref 5.0–8.0)

## 2021-05-02 LAB — CBC
HCT: 38 % (ref 36.0–46.0)
Hemoglobin: 12.4 g/dL (ref 12.0–15.0)
MCH: 29.2 pg (ref 26.0–34.0)
MCHC: 32.6 g/dL (ref 30.0–36.0)
MCV: 89.6 fL (ref 80.0–100.0)
Platelets: 180 10*3/uL (ref 150–400)
RBC: 4.24 MIL/uL (ref 3.87–5.11)
RDW: 12.3 % (ref 11.5–15.5)
WBC: 5.5 10*3/uL (ref 4.0–10.5)
nRBC: 0 % (ref 0.0–0.2)

## 2021-05-02 LAB — LIPASE, BLOOD: Lipase: 30 U/L (ref 11–51)

## 2021-05-02 MED ORDER — OMEPRAZOLE 20 MG PO CPDR
20.0000 mg | DELAYED_RELEASE_CAPSULE | Freq: Every day | ORAL | 0 refills | Status: AC
Start: 2021-05-02 — End: ?

## 2021-05-02 MED ORDER — METOCLOPRAMIDE HCL 10 MG PO TABS: 10.0000 mg | ORAL_TABLET | Freq: Three times a day (TID) | ORAL | 0 refills | Status: AC | PRN

## 2021-05-02 MED ORDER — OMEPRAZOLE 20 MG PO CPDR: 20.0000 mg | DELAYED_RELEASE_CAPSULE | Freq: Every day | ORAL | 0 refills | Status: DC

## 2021-05-02 MED ORDER — METOCLOPRAMIDE HCL 10 MG PO TABS: 10.0000 mg | ORAL_TABLET | Freq: Three times a day (TID) | ORAL | 0 refills | Status: DC | PRN

## 2021-05-02 NOTE — ED Triage Notes (Signed)
Patient with history of gastric ulcer diagnosed three months ago by endoscopy complains of epigastric pain, nausea, and vomiting. Patient alert, oriented, and in no apparent distress at this time. Patient states she was prescribed medication to treat ulcer and has been taking it as prescribed.

## 2021-05-02 NOTE — ED Notes (Signed)
No active vomiting. No signs of distress. VSS. Skin w/d/I.

## 2021-05-02 NOTE — Discharge Instructions (Addendum)
Your previous gastric emptying study showed possible gastroparesis.  Being treated start the new medicine to help with the vomiting.  Continue the medicines you are already on.  Follow-up with your gastroenterologist for further evaluation of the persistent vomiting and epigastric pain.

## 2021-05-02 NOTE — ED Provider Notes (Signed)
[facility] EMERGENCY DEPARTMENT Provider Note   CSN: [id number] Arrival date & time: 05/02/21  6712     History  Chief Complaint  Patient presents with   Abdominal Pain    Amber Patton is a 75 y.o. female.   Abdominal Pain Associated symptoms: nausea and vomiting   Associated symptoms: no fever and no shortness of breath   Patient with history of diabetes gastric ulcers and gastroparesis presents with epigastric abdominal pain nausea vomiting.  Has had for a year now.  Has been seen and had endoscopy which reportedly showed ulcers.  Had been started on Carafate and Linzess by her gastroenterologist.  States continues to have nausea and vomiting.  Previously had been on Reglan but states he had been stopped after about a year because her PCP said it would cause motor issues for her.  No diarrhea.  Pain is in the epigastric area.  Worse after eating.    Home Medications Prior to Admission medications   Medication Sig Start Date End Date Taking? Authorizing Provider  amLODipine (NORVASC) 10 MG tablet Take 10 mg by mouth daily.    [provider]  atorvastatin (LIPITOR) 10 MG tablet Take 10 mg by mouth daily.    [provider]  buPROPion (WELLBUTRIN XL) 150 MG 24 hr tablet Take 450 mg by mouth daily.     [provider]  butalbital-acetaminophen-caffeine (FIORICET, ESGIC) 50-325-40 MG per tablet Take 1 tablet by mouth 2 (two) times daily as needed for headache.    [provider]  citalopram (CELEXA) 10 MG tablet Take 10 mg by mouth daily.    [provider]  glimepiride (AMARYL) 4 MG tablet Take 4 mg by mouth daily before breakfast.    [provider]  lisinopril (PRINIVIL,ZESTRIL) 20 MG tablet Take 1 tablet (20 mg total) by mouth daily. 12/10/15   Albertine Grates, MD  metFORMIN (GLUCOPHAGE-XR) 500 MG 24 hr tablet Take 1,500 mg by mouth daily with breakfast.     [provider]  metoCLOPramide (REGLAN) 10  MG tablet Take 1 tablet (10 mg total) by mouth every 8 (eight) hours as needed for nausea. 05/02/21   Benjiman Core, MD  mirtazapine (REMERON) 7.5 MG tablet Take 1 tablet by mouth at bedtime. 01/15/16   [provider]  omeprazole (PRILOSEC) 20 MG capsule Take 1 capsule (20 mg total) by mouth daily. 05/02/21   Benjiman Core, MD  QUEtiapine (SEROQUEL) 25 MG tablet Take 25 mg by mouth at bedtime.    [provider]  traZODone (DESYREL) 50 MG tablet Take 50 mg by mouth at bedtime.    [provider]      Allergies    Aspirin and Vicodin [hydrocodone-acetaminophen]    Review of Systems   Review of Systems  Constitutional:  Negative for fever and unexpected weight change.  Respiratory:  Negative for shortness of breath.   Gastrointestinal:  Positive for abdominal pain, nausea and vomiting. Negative for blood in stool.  Neurological:  Negative for weakness.   Physical Exam Updated Vital Signs BP 132/67 (BP Location: Left Arm)    Pulse 75    Temp 98.2 F (36.8 C) (Oral)    Resp 16    Ht 4\' 9"  (1.448 m)    Wt 70.3 kg    SpO2 98%    BMI 33.54 kg/m  Physical Exam Vitals and nursing note reviewed.  Abdominal:     Tenderness: There is abdominal tenderness.  Hernia: No hernia is present.     Comments: Epigastric tenderness no rebound or guarding.  No hernia palpated  Skin:    General: Skin is warm.     Capillary Refill: Capillary refill takes less than 2 seconds.  Neurological:     Mental Status: She is alert and oriented to person, place, and time.    ED Results / Procedures / Treatments   Labs (all labs ordered are listed, but only abnormal results are displayed) Labs Reviewed  COMPREHENSIVE METABOLIC PANEL - Abnormal; Notable for the following components:      Result Value   Glucose, Bld 108 (*)    All other components within normal limits  URINALYSIS, ROUTINE W REFLEX MICROSCOPIC - Abnormal; Notable for the following components:   Leukocytes,Ua  MODERATE (*)    Bacteria, UA RARE (*)    All other components within normal limits  LIPASE, BLOOD  CBC    EKG EKG Interpretation  Date/Time:  Friday May 02 2021 07:18:08 EST Ventricular Rate:  82 PR Interval:  246 QRS Duration: 74 QT Interval:  354 QTC Calculation: 413 R Axis:   -41 Text Interpretation: Sinus rhythm with sinus arrhythmia with 1st degree A-V block Left axis deviation Low voltage QRS Cannot rule out Anterior infarct , age undetermined Abnormal ECG When compared with ECG of 06-Feb-2016 11:23, PREVIOUS ECG IS PRESENT Confirmed by Benjiman CorePickering, Odalys Win [id number]) on 05/02/2021 11:39:35 AM  Radiology No results found.  Procedures Procedures    Medications Ordered in ED Medications - No data to display  ED Course/ Medical Decision Making/ A&P                           Medical Decision Making Problems Addressed: Epigastric pain: chronic illness or injury Nausea and vomiting, unspecified vomiting type: chronic illness or injury  Amount and/or Complexity of Data Reviewed Labs: ordered.  Risk Prescription drug management.   Patient presents with epigastric abdominal pain.  Has had for months now.  Has been seen by ENT.  Had endoscopy.  Endoscopy done in September per patient showed ulcers although report did not mention this.  It did not specifically mention the stomach though.  Did have hiatal hernia.  Is on Carafate.  Reviewing records appears that she has a history of gastroparesis.  States when she eats he will sometimes feels the food makes her vomit.  Has had previous gastric emptying study that did show gastroparesis.  Potentially could be the cause of this.  We will start patient on proton pump inhibitor and also start some Reglan.  Had been stopped years ago because she states her PCP told her it would give her movement issues.  States she did not have any movement issues with it however.  Will have follow-up with her gastroenterologist or PCP.  Reviewed lab  work and was reassuring.  Urine does not necessarily show infection and does not have any urinary symptoms.  Discharge home.        Final Clinical Impression(s) / ED Diagnoses Final diagnoses:  Epigastric pain  Nausea and vomiting, unspecified vomiting type    Rx / DC Orders ED Discharge Orders          Ordered    metoCLOPramide (REGLAN) 10 MG tablet  Every 8 hours PRN,   Status:  Discontinued        05/02/21 1141    omeprazole (PRILOSEC) 20 MG capsule  Daily,   Status:  Discontinued  05/02/21 1141    metoCLOPramide (REGLAN) 10 MG tablet  Every 8 hours PRN        05/02/21 1156    omeprazole (PRILOSEC) 20 MG capsule  Daily        05/02/21 1156              Benjiman Core, MD 05/02/21 1530

## 2023-05-21 ENCOUNTER — Emergency Department
Admission: EM | Admit: 2023-05-21 | Discharge: 2023-05-21 | Disposition: A | Discharge status: Home / Self Care | Payer: Medicare Other | Attending: Student in an Organized Health Care Education/Training Program | Admitting: Student in an Organized Health Care Education/Training Program

## 2023-05-21 ENCOUNTER — Other Ambulatory Visit: Payer: Self-pay

## 2023-05-21 DIAGNOSIS — Y9301 Activity, walking, marching and hiking: Secondary | ICD-10-CM | POA: Insufficient documentation

## 2023-05-21 DIAGNOSIS — S90852A Superficial foreign body, left foot, initial encounter: Secondary | ICD-10-CM | POA: Insufficient documentation

## 2023-05-21 DIAGNOSIS — Z23 Encounter for immunization: Secondary | ICD-10-CM | POA: Insufficient documentation

## 2023-05-21 DIAGNOSIS — W458XXA Other foreign body or object entering through skin, initial encounter: Secondary | ICD-10-CM | POA: Insufficient documentation

## 2023-05-21 MED ORDER — LIDOCAINE HCL (PF) 1 % IJ SOLN
5.0000 mL | Freq: Once | INTRAMUSCULAR | Status: AC
  Administered 2023-05-21: 5 mL
  Filled 2023-05-21: qty 5

## 2023-05-21 MED ORDER — TETANUS-DIPHTH-ACELL PERTUSSIS 5-2.5-18.5 LF-MCG/0.5 IM SUSY
0.5000 mL | PREFILLED_SYRINGE | Freq: Once | INTRAMUSCULAR | Status: AC
  Administered 2023-05-21: 0.5 mL via INTRAMUSCULAR
  Filled 2023-05-21: qty 0.5

## 2023-05-21 NOTE — Discharge Instructions (Addendum)
 Your evaluated in the ED for a splinter in the bottom of your foot.  This was removed successfully.  Your tetanus was updated during this visit.  Please keep the area clean at all times.  Keep foot elevated.  Apply ice to the affected area as needed for comfort and to reduce swelling as needed.   Monitor for symptoms of infection which include redness and spreading up your foot, fever and green-yellowish discharge from the area.  This would indicate a return to the ED for further evaluation.  Take Tylenol  for pain as needed.

## 2023-05-21 NOTE — ED Provider Notes (Addendum)
 [facility] Emergency Department Provider Note     Event Date/Time   First MD Initiated Contact with Patient 05/21/23 2034     (approximate)   History   Foot Pain   HPI  Amber Patton is a 78 y.o. female who presents with an approx. 3 cm wooden splinter in her left foot after walking across her deck this evening.  The splinter is fully visible and shallow at the base of her great toe.  She has tenderness to palpation, but can move her foot and all toes without difficulty.      Physical Exam   Triage Vital Signs: ED Triage Vitals  Encounter Vitals Group     BP 05/21/23 1939 130/74     Systolic BP Percentile --      Diastolic BP Percentile --      Pulse Rate 05/21/23 1939 68     Resp 05/21/23 1939 16     Temp 05/21/23 1939 98.7 F (37.1 C)     Temp Source 05/21/23 1939 Oral     SpO2 05/21/23 1939 99 %     Weight 05/21/23 1941 165 lb (74.8 kg)     Height 05/21/23 1941 4' 11 (1.499 m)     Head Circumference --      Peak Flow --      Pain Score --      Pain Loc --      Pain Education --      Exclude from Growth Chart --     Most recent vital signs: Vitals:   05/21/23 1939  BP: 130/74  Pulse: 68  Resp: 16  Temp: 98.7 F (37.1 C)  SpO2: 99%    General Awake, no distress.  HEENT NCAT. PERRL. EOMI. No rhinorrhea. Mucous membranes are moist.  CV:  Good peripheral perfusion.  RESP:  Normal effort.  ABD:  No distention.  Other:  Approximate 3 cm wound splinter on ventral aspect of left foot inferior to base of great toe.    ED Results / Procedures / Treatments   Labs (all labs ordered are listed, but only abnormal results are displayed) Labs Reviewed - No data to display  No results found.  PROCEDURES:  Critical Care performed: No  .Foreign Body Removal  Date/Time: 05/21/2023 9:27 PM  Performed by: Margrette Rebbeca LABOR, PA-C Authorized by: Margrette Rebbeca LABOR, PA-C  Consent: Verbal consent obtained. Consent given by:  patient Body area: skin General location: lower extremity Location details: left foot Anesthesia: local infiltration  Anesthesia: Local Anesthetic: lidocaine  1% without epinephrine and lidocaine  spray  Sedation: Patient sedated: no  Patient restrained: no Patient cooperative: yes Localization method: visualized Removal mechanism: forceps Dressing: dressing applied Tendon involvement: none Depth: epidermal. Complexity: simple 1 objects recovered. Objects recovered: wooden piece 3 Post-procedure assessment: foreign body removed Patient tolerance: patient tolerated the procedure well with no immediate complications     MEDICATIONS ORDERED IN ED: Medications  Tdap (BOOSTRIX) injection 0.5 mL (has no administration in time range)  lidocaine  (PF) (XYLOCAINE ) 1 % injection 5 mL (5 mLs Infiltration Given 05/21/23 2108)     IMPRESSION / MDM / ASSESSMENT AND PLAN / ED COURSE  I reviewed the triage vital signs and the nursing notes.                               78 y.o. female presents to the emergency department for evaluation and  treatment of wooden splinter. See HPI for further details.   Differential diagnosis includes, but is not limited to splinter, laceration  Patient's presentation is most consistent with acute, uncomplicated illness.  Patient is alert and oriented.  She is hemodynamically stable.  Physical exam findings are as stated above.  When splinter successfully removed following lidocaine  injection and using splinter forceps.  Improvement of pain per patient.  Steri-Strip placed over incision that was made. Unknown tetanus status.  Tetanus updated in ED.  Patient is in stable condition for discharge home.    FINAL CLINICAL IMPRESSION(S) / ED DIAGNOSES   Final diagnoses:  Foreign body in left foot, initial encounter   Rx / DC Orders   ED Discharge Orders     None      Note:  This document was prepared using Dragon voice recognition software and may  include unintentional dictation errors.    Margrette, Neyland Pettengill A, PA-C 05/21/23 2126    Margrette Monte A, PA-C 05/21/23 2129    Lang Dover, MD 05/21/23 2134

## 2023-05-21 NOTE — ED Triage Notes (Addendum)
 Pt presents to ER from home, reports she slipped at home on the deck and got a splinter to her left foot. Pt talks in complete sentences no respiratory distress. Pt unsure about Tdap vaccine.
